# Patient Record
Sex: Male | Born: 1965 | Race: Black or African American | Hispanic: No | Marital: Single | Smoking: Current every day smoker
Health system: Southern US, Community
[De-identification: ages and names within clinical notes are randomized; demographics above are authoritative.]

## PROBLEM LIST (undated history)

## (undated) DIAGNOSIS — H269 Unspecified cataract: Secondary | ICD-10-CM

## (undated) DIAGNOSIS — E119 Type 2 diabetes mellitus without complications: Secondary | ICD-10-CM

## (undated) DIAGNOSIS — G629 Polyneuropathy, unspecified: Secondary | ICD-10-CM

## (undated) DIAGNOSIS — E7800 Pure hypercholesterolemia, unspecified: Secondary | ICD-10-CM

## (undated) DIAGNOSIS — I10 Essential (primary) hypertension: Secondary | ICD-10-CM

## (undated) HISTORY — PX: EAR CYST EXCISION: SHX22

---

## 2001-01-02 ENCOUNTER — Emergency Department (HOSPITAL_COMMUNITY): Admission: EM | Admit: 2001-01-02 | Discharge: 2001-01-02 | Payer: Self-pay | Admitting: Internal Medicine

## 2001-10-27 ENCOUNTER — Emergency Department (HOSPITAL_COMMUNITY): Admission: EM | Admit: 2001-10-27 | Discharge: 2001-10-27 | Payer: Self-pay | Admitting: Emergency Medicine

## 2001-10-27 ENCOUNTER — Encounter: Payer: Self-pay | Admitting: Emergency Medicine

## 2007-08-27 ENCOUNTER — Emergency Department (HOSPITAL_COMMUNITY): Admission: EM | Admit: 2007-08-27 | Discharge: 2007-08-27 | Payer: Self-pay | Admitting: Emergency Medicine

## 2007-11-01 ENCOUNTER — Emergency Department (HOSPITAL_COMMUNITY): Admission: EM | Admit: 2007-11-01 | Discharge: 2007-11-01 | Payer: Self-pay | Admitting: Emergency Medicine

## 2008-11-21 ENCOUNTER — Emergency Department (HOSPITAL_COMMUNITY): Admission: EM | Admit: 2008-11-21 | Discharge: 2008-11-21 | Payer: Self-pay | Admitting: Emergency Medicine

## 2010-12-18 LAB — RAPID STREP SCREEN (MED CTR MEBANE ONLY): Streptococcus, Group A Screen (Direct): NEGATIVE

## 2011-06-12 LAB — URINE MICROSCOPIC-ADD ON

## 2011-06-12 LAB — URINALYSIS, ROUTINE W REFLEX MICROSCOPIC
Bilirubin Urine: NEGATIVE
Glucose, UA: NEGATIVE
Ketones, ur: NEGATIVE
Leukocytes, UA: NEGATIVE
Nitrite: NEGATIVE
Protein, ur: 30 — AB
Specific Gravity, Urine: >1.03 — ABNORMAL HIGH
Urobilinogen, UA: 0.2
pH: 6

## 2011-06-12 LAB — GC/CHLAMYDIA PROBE AMP, GENITAL
Chlamydia, DNA Probe: NEGATIVE
GC Probe Amp, Genital: NEGATIVE

## 2011-06-12 LAB — URINE CULTURE: Colony Count: 8000

## 2012-11-22 ENCOUNTER — Emergency Department (HOSPITAL_COMMUNITY)
Admission: EM | Admit: 2012-11-22 | Discharge: 2012-11-22 | Disposition: A | Discharge status: Home / Self Care | Payer: Managed Care, Other (non HMO) | Attending: Emergency Medicine | Admitting: Emergency Medicine

## 2012-11-22 ENCOUNTER — Encounter (HOSPITAL_COMMUNITY): Payer: Self-pay

## 2012-11-22 DIAGNOSIS — F17200 Nicotine dependence, unspecified, uncomplicated: Secondary | ICD-10-CM | POA: Insufficient documentation

## 2012-11-22 DIAGNOSIS — K469 Unspecified abdominal hernia without obstruction or gangrene: Secondary | ICD-10-CM | POA: Insufficient documentation

## 2012-11-22 DIAGNOSIS — K4090 Unilateral inguinal hernia, without obstruction or gangrene, not specified as recurrent: Secondary | ICD-10-CM

## 2012-11-22 DIAGNOSIS — R112 Nausea with vomiting, unspecified: Secondary | ICD-10-CM | POA: Insufficient documentation

## 2012-11-22 NOTE — ED Provider Notes (Signed)
History  This chart was scribed for Benny Lennert, MD by Bennett Scrape, ED Scribe. This patient was seen in room APA19/AP[facility]19 and the patient's care was started at 7:36 AM.  CSN: [id number]  Arrival date & time 11/22/12  0729   First MD Initiated Contact with Patient 11/22/12 [phone]      Chief Complaint  Patient presents with  . Abdominal Pain     Patient is a 47 y.o. male presenting with groin pain. The history is provided by the patient. No language interpreter was used.  Groin Pain This is a new problem. The current episode started more than 1 week ago. Pertinent negatives include no abdominal pain and no headaches.    Dylan Roberts is a 47 y.o. male who presents to the Emergency Department complaining of 3 weeks of intermittent right inguinal pain described as achy with associated nausea and emesis. He reports that he first noticed the pain after heavy lifting. He denies any prior abdominal surgeries. He denies any fevers, back pain, abdominal pain or diarrhea. He does not have a h/o chronic medical conditions but is a current everyday smoker and occasional alcohol user.  Pt works in a [employer]   History reviewed. No pertinent past medical history.  History reviewed. No pertinent past surgical history.  No family history on file.  History  Substance Use Topics  . Smoking status: Current Every Day Smoker    Types: Cigarettes  . Smokeless tobacco: Not on file  . Alcohol Use: Yes     Comment: daily      Review of Systems  HENT: Negative for congestion, sinus pressure and ear discharge.   Eyes: Negative for discharge.  Gastrointestinal: Positive for nausea and vomiting. Negative for abdominal pain.  Genitourinary: Negative for frequency.       Right inguinal pain  Musculoskeletal: Negative for back pain.  Skin: Negative for rash.  Neurological: Negative for seizures and headaches.  Psychiatric/Behavioral: Negative for hallucinations.    Allergies   Review of patient's allergies indicates not on file.  Home Medications  No current outpatient prescriptions on file.  Triage Vitals: BP 167/102  Pulse 90  Temp(Src) 97.8 F (36.6 C) (Oral)  Resp 20  Ht 5\' 8"  (1.727 m)  Wt 151 lb (68.493 kg)  BMI 22.96 kg/m2  SpO2 100%  Physical Exam  Nursing note and vitals reviewed. Constitutional: He is oriented to person, place, and time. He appears well-developed and well-nourished.  HENT:  Head: Normocephalic and atraumatic.  Eyes: Conjunctivae and EOM are normal. No scleral icterus.  Neck: Neck supple. No thyromegaly present.  Cardiovascular: Normal rate and regular rhythm.  Exam reveals no gallop and no friction rub.   No murmur heard. Pulmonary/Chest: No stridor. He has no wheezes. He has no rales. He exhibits no tenderness.  Abdominal: He exhibits no distension. There is no tenderness. There is no rebound.  Genitourinary:  Moderate right inguinal hernia, minimal tenderness in the right groin  Musculoskeletal: Normal range of motion. He exhibits no edema.  Lymphadenopathy:    He has no cervical adenopathy.  Neurological: He is oriented to person, place, and time. Coordination normal.  Skin: No rash noted. No erythema.  Psychiatric: He has a normal mood and affect. His behavior is normal.    ED Course  Procedures (including critical care time)  DIAGNOSTIC STUDIES: Oxygen Saturation is 100% on room air, normal by my interpretation.    COORDINATION OF CARE: 7:47 AM-Discussed discharge plan which includes  heavy lifting restrictions and motrin or tylenol for pain with pt and pt agreed to plan. Also advised pt to follow up with surgeon referral and pt agreed.  Labs Reviewed - No data to display No results found.   No diagnosis found.    MDM       The chart was scribed for me under my direct supervision.  I personally performed the history, physical, and medical decision making and all procedures in the evaluation of this  patient.Benny Lennert, MD 11/22/12 [phone]

## 2012-11-22 NOTE — ED Notes (Signed)
Pt reports ?hearnia for last 2-3 weeks, pt has swelling to mid ab area, noted after heavy lifting and describes as "pins".

## 2012-12-01 ENCOUNTER — Encounter (HOSPITAL_COMMUNITY): Payer: Self-pay | Admitting: Pharmacy Technician

## 2012-12-01 NOTE — H&P (Signed)
  NTS SOAP Note  Vital Signs:  Vitals as of: 12/01/2012: Systolic 153: Diastolic 99: Heart Rate 89: Temp 97.10F: Height 71ft 8in: Weight 145Lbs 0 Ounces: Pain Level 7: BMI 22.05  BMI : 22.05 kg/m2  Subjective: This 47 Years 26 Months old Male presents for of    HERNIA: ,Has had a right groin pain and swelling over the past three months.  Made worse with straining.  Makes it difficult to work.  Has to lift 60 pounds.  Review of Symptoms:  Constitutional:unremarkable   Head:unremarkable    Eyes:unremarkable   Nose/Mouth/Throat:unremarkable Cardiovascular:  unremarkable   Respiratory:unremarkable   Gastrointestin    abdominal pain,heartburn Genitourinary:unremarkable     Musculoskeletal:unremarkable   Skin:unremarkable Hematolgic/Lymphatic:unremarkable     Allergic/Immunologic:unremarkable     Past Medical History:    Reviewed   Past Medical History  Surgical History: none Medical Problems: none Allergies: nkda Medications: none   Social History:Reviewed  Social History  Preferred Language: English Race:  Black or African American Ethnicity: Not Hispanic / Latino Age: 47 Years 6 Months Marital Status:  M Alcohol:  Yes, How much 2 beers per day Recreational drug(s):  No   Smoking Status: Current every day smoker reviewed on 12/01/2012 Started Date: 09/07/1985 Packs per day: 1.00 Functional Status reviewed on mm/dd/yyyy ------------------------------------------------ Bathing: Normal Cooking: Normal Dressing: Normal Driving: Normal Eating: Normal Managing Meds: Normal Oral Care: Normal Shopping: Normal Toileting: Normal Transferring: Normal Walking: Normal Cognitive Status reviewed on mm/dd/yyyy ------------------------------------------------ Attention: Normal Decision Making: Normal Language: Normal Memory: Normal Motor: Normal Perception: Normal Problem Solving: Normal Visual and Spatial: Normal   Family  History:  Reviewed   Family History              Mother:  Diabetes Type II    Objective Information: General:  Well appearing, well nourished in no distress. Heart:  RRR, no murmur or gallop.  Normal S1, S2.  No S3, S4.  Lungs:    CTA bilaterally, no wheezes, rhonchi, rales.  Breathing unlabored. Abdomen:Soft, NT/ND, no HSM, no masses.  Reducible right inguinal hernia. ZO:[id number]    Assessment:Right inguinal hernia  Diagnosis &amp; Procedure Smart Code   Plan:Scheduled for right inguinal herniorrhaphy on 12/09/12.   Patient Education:Alternative treatments to surgery were discussed with patient (and family).  Risks and benefits  of procedure were fully explained to the patient (and family) who gave informed consent. Patient/family questions were addressed.  Follow-up:Pending Surgery

## 2012-12-02 NOTE — Patient Instructions (Addendum)
Dylan Roberts  12/02/2012   Your procedure is scheduled on:  12/09/2012  Report to [facility] at  6:15  AM.  Call this number if you have problems the morning of surgery: [phone]   Remember:   Do not eat food or drink liquids after midnight.   Take these medicines the morning of surgery with A SIP OF WATER: none   Do not wear jewelry, make-up or nail polish.  Do not wear lotions, powders, or perfumes.  Do not shave 48 hours prior to surgery. Men may shave face and neck.  Do not bring valuables to the hospital.  Contacts, dentures or bridgework may not be worn into surgery.  Leave suitcase in the car. After surgery it may be brought to your room.   Patients discharged the day of surgery will not be allowed to drive  home.    Special Instructions: Shower using CHG 2 nights before surgery and the night before surgery.  If you shower the day of surgery use CHG.  Use special wash - you have one bottle of CHG for all showers.  You should use approximately 1/3 of the bottle for each shower.   Please read over the following fact sheets that you were given: Pain Booklet, Coughing and Deep Breathing, MRSA Information, Surgical Site Infection Prevention, Anesthesia Post-op Instructions and Care and Recovery After Surgery    Hernia, Surgical Repair A hernia occurs when an internal organ pushes out through a weak spot in the belly (abdominal) wall muscles. Hernias commonly occur in the groin and around the navel. Hernias often can be pushed back into place (reduced). Most hernias tend to get worse over time. Problems occur when abdominal contents get stuck in the opening (incarcerated hernia). The blood supply gets cut off (strangulated hernia). This is an emergency and needs surgery. Otherwise, hernia repair can be an elective procedure. This means you can schedule this at your convenience when an emergency is not present. Because complications can occur, if you decide to repair the hernia,  it is best to do it soon. When it becomes an emergency procedure, there is increased risk of complications after surgery. CAUSES   Heavy lifting.  Obesity.  Prolonged coughing.  Straining to move your bowels.  Hernias can also occur through a cut (incision) by a surgeonafter an abdominal operation. HOME CARE INSTRUCTIONS Before the repair:  Bed rest is not required. You may continue your normal activities, but avoid heavy lifting (more than 10 pounds) or straining. Cough gently. If you are a smoker, it is best to stop. Even the best hernia repair can break down with the continual strain of coughing.  Do not wear anything tight over your hernia. Do not try to keep it in with an outside bandage or truss. These can damage abdominal contents if they are trapped in the hernia sac.  Eat a normal diet. Avoid constipation. Straining over long periods of time to have a bowel movement will increase hernia size. It also can breakdown repairs. If you cannot do this with diet alone, laxatives or stool softeners may be used. PRIOR TO SURGERY, SEEK IMMEDIATE MEDICAL CARE IF: You have problems (symptoms) of a trapped (incarcerated) hernia. Symptoms include:  An oral temperature above 102 F (38.9 C) develops, or as your caregiver suggests.  Increasing abdominal pain.  Feeling sick to your stomach(nausea) and vomiting.  You stop passing gas or stool.  The hernia is stuck outside the abdomen, looks discolored,  feels hard, or is tender.  You have any changes in your bowel habits or in the hernia that is unusual for you. LET YOUR CAREGIVERS KNOW ABOUT THE FOLLOWING:  Allergies.  Medications taken including herbs, eye drops, over the counter medications, and creams.  Use of steroids (by mouth or creams).  Family or personal history of problems with anesthetics or Novocaine.  Possibility of pregnancy, if this applies.  Personal history of blood clots (thrombophlebitis).  Family or personal  history of bleeding or blood problems.  Previous surgery.  Other health problems. BEFORE THE PROCEDURE You should be present 1 hour prior to your procedure, or as directed by your caregiver.  AFTER THE PROCEDURE After surgery, you will be taken to the recovery area. A nurse will watch and check your progress there. Once you are awake, stable, and taking fluids well, you will be allowed to go home as long as there are no problems. Once home, an ice pack (wrapped in a light towel) applied to your operative site may help with discomfort. It may also keep the swelling down. Do not lift anything heavier than 10 pounds (4.55 kilograms). Take showers not baths. Do not drive while taking narcotics. Follow instructions as suggested by your caregiver.  SEEK IMMEDIATE MEDICAL CARE IF: After surgery:  There is redness, swelling, or increasing pain in the wound.  There is pus coming from the wound.  There is drainage from a wound lasting longer than 1 day.  An unexplained oral temperature above 102 F (38.9 C) develops.  You notice a foul smell coming from the wound or dressing.  There is a breaking open of a wound (edged not staying together) after the sutures have been removed.  You notice increasing pain in the shoulders (shoulder strap areas).  You develop dizzy episodes or fainting while standing.  You develop persistent nausea or vomiting.  You develop a rash.  You have difficulty breathing.  You develop any reaction or side effects to medications given. MAKE SURE YOU:   Understand these instructions.  Will watch your condition.  Will get help right away if you are not doing well or get worse. Document Released: 02/17/2001 Document Revised: 11/16/2011 Document Reviewed: 01/10/2008 [facility] Patient Information 2013 [place], [place].   PATIENT INSTRUCTIONS POST-ANESTHESIA  IMMEDIATELY FOLLOWING SURGERY:  Do not drive or operate machinery for the first twenty four hours after  surgery.  Do not make any important decisions for twenty four hours after surgery or while taking narcotic pain medications or sedatives.  If you develop intractable nausea and vomiting or a severe headache please notify your doctor immediately.  FOLLOW-UP:  Please make an appointment with your surgeon as instructed. You do not need to follow up with anesthesia unless specifically instructed to do so.  WOUND CARE INSTRUCTIONS (if applicable):  Keep a dry clean dressing on the anesthesia/puncture wound site if there is drainage.  Once the wound has quit draining you may leave it open to air.  Generally you should leave the bandage intact for twenty four hours unless there is drainage.  If the epidural site drains for more than 36-48 hours please call the anesthesia department.  QUESTIONS?:  Please feel free to call your physician or the hospital operator if you have any questions, and they will be happy to assist you.

## 2012-12-05 ENCOUNTER — Encounter (HOSPITAL_COMMUNITY)
Admission: RE | Admit: 2012-12-05 | Discharge: 2012-12-05 | Disposition: A | Discharge status: Home / Self Care | Payer: Managed Care, Other (non HMO) | Source: Ambulatory Visit | Attending: General Surgery | Admitting: General Surgery

## 2012-12-05 ENCOUNTER — Encounter (HOSPITAL_COMMUNITY): Payer: Self-pay

## 2012-12-05 LAB — CBC WITH DIFFERENTIAL/PLATELET
Basophils Absolute: 0.1 10*3/uL (ref 0.0–0.1)
Basophils Relative: 1 % (ref 0–1)
Eosinophils Absolute: 0.2 10*3/uL (ref 0.0–0.7)
Eosinophils Relative: 2 % (ref 0–5)
HCT: 43.1 % (ref 39.0–52.0)
Hemoglobin: 15.1 g/dL (ref 13.0–17.0)
Lymphocytes Relative: 28 % (ref 12–46)
Lymphs Abs: 2.3 10*3/uL (ref 0.7–4.0)
MCH: 30.1 pg (ref 26.0–34.0)
MCHC: 35 g/dL (ref 30.0–36.0)
MCV: 86 fL (ref 78.0–100.0)
Monocytes Absolute: 0.3 10*3/uL (ref 0.1–1.0)
Monocytes Relative: 4 % (ref 3–12)
Neutro Abs: 5.3 10*3/uL (ref 1.7–7.7)
Neutrophils Relative %: 65 % (ref 43–77)
Platelets: 215 10*3/uL (ref 150–400)
RBC: 5.01 MIL/uL (ref 4.22–5.81)
RDW: 13.6 % (ref 11.5–15.5)
WBC: 8.2 10*3/uL (ref 4.0–10.5)

## 2012-12-05 LAB — BASIC METABOLIC PANEL
BUN: 9 mg/dL (ref 6–23)
CO2: 24 mEq/L (ref 19–32)
Calcium: 9.4 mg/dL (ref 8.4–10.5)
Chloride: 103 mEq/L (ref 96–112)
Creatinine, Ser: 0.91 mg/dL (ref 0.50–1.35)
GFR calc Af Amer: >90 mL/min (ref >90)
GFR calc non Af Amer: >90 mL/min (ref >90)
Glucose, Bld: 236 mg/dL — ABNORMAL HIGH (ref 70–99)
Potassium: 4 mEq/L (ref 3.5–5.1)
Sodium: 138 mEq/L (ref 135–145)

## 2012-12-05 LAB — SURGICAL PCR SCREEN
MRSA, PCR: NEGATIVE
Staphylococcus aureus: NEGATIVE

## 2012-12-05 MED ORDER — CHLORHEXIDINE GLUCONATE 4 % EX LIQD: 1.0000 "application " | Freq: Once | CUTANEOUS | Status: DC

## 2012-12-08 NOTE — OR Nursing (Signed)
cbg   from pat workup 236, reported to Dr. Lovell Sheehan and Dr Jayme Cloud. Orders to perform cbg on arrival to preop.

## 2012-12-09 ENCOUNTER — Encounter (HOSPITAL_COMMUNITY): Payer: Self-pay | Admitting: Anesthesiology

## 2012-12-09 ENCOUNTER — Ambulatory Visit (HOSPITAL_COMMUNITY)
Admission: RE | Admit: 2012-12-09 | Discharge: 2012-12-09 | Disposition: A | Discharge status: Home / Self Care | Payer: Managed Care, Other (non HMO) | Source: Ambulatory Visit | Attending: General Surgery | Admitting: General Surgery

## 2012-12-09 ENCOUNTER — Ambulatory Visit (HOSPITAL_COMMUNITY): Payer: Managed Care, Other (non HMO) | Admitting: Anesthesiology

## 2012-12-09 ENCOUNTER — Encounter (HOSPITAL_COMMUNITY)
Admission: RE | Disposition: A | Discharge status: Home / Self Care | Payer: Self-pay | Source: Ambulatory Visit | Attending: General Surgery

## 2012-12-09 ENCOUNTER — Encounter (HOSPITAL_COMMUNITY): Payer: Self-pay | Admitting: *Deleted

## 2012-12-09 DIAGNOSIS — K4090 Unilateral inguinal hernia, without obstruction or gangrene, not specified as recurrent: Secondary | ICD-10-CM | POA: Insufficient documentation

## 2012-12-09 DIAGNOSIS — Z01812 Encounter for preprocedural laboratory examination: Secondary | ICD-10-CM | POA: Insufficient documentation

## 2012-12-09 HISTORY — PX: INSERTION OF MESH: SHX5868

## 2012-12-09 HISTORY — PX: INGUINAL HERNIA REPAIR: SHX194

## 2012-12-09 LAB — GLUCOSE, CAPILLARY
Glucose-Capillary: 209 mg/dL — ABNORMAL HIGH (ref 70–99)
Glucose-Capillary: 185 mg/dL — ABNORMAL HIGH (ref 70–99)

## 2012-12-09 SURGERY — REPAIR, HERNIA, INGUINAL, ADULT
Anesthesia: General | Site: Abdomen | Laterality: Right | Wound class: Clean

## 2012-12-09 MED ORDER — BUPIVACAINE HCL (PF) 0.5 % IJ SOLN
INTRAMUSCULAR | Status: AC
  Filled 2012-12-09: qty 30

## 2012-12-09 MED ORDER — FENTANYL CITRATE 0.05 MG/ML IJ SOLN
INTRAMUSCULAR | Status: AC
  Filled 2012-12-09: qty 2

## 2012-12-09 MED ORDER — OXYCODONE-ACETAMINOPHEN 7.5-325 MG PO TABS: 1.0000 | ORAL_TABLET | ORAL | Status: DC | PRN

## 2012-12-09 MED ORDER — MIDAZOLAM HCL 2 MG/2ML IJ SOLN
INTRAMUSCULAR | Status: AC
  Filled 2012-12-09: qty 2

## 2012-12-09 MED ORDER — KETOROLAC TROMETHAMINE 30 MG/ML IJ SOLN
INTRAMUSCULAR | Status: AC
  Filled 2012-12-09: qty 1

## 2012-12-09 MED ORDER — LIDOCAINE HCL (CARDIAC) 10 MG/ML IV SOLN
INTRAVENOUS | Status: DC | PRN
  Administered 2012-12-09: 20 mg via INTRAVENOUS

## 2012-12-09 MED ORDER — ONDANSETRON HCL 4 MG/2ML IJ SOLN
INTRAMUSCULAR | Status: AC
  Filled 2012-12-09: qty 2

## 2012-12-09 MED ORDER — SODIUM CHLORIDE 0.9 % IR SOLN
Status: DC | PRN
  Administered 2012-12-09: 1000 mL

## 2012-12-09 MED ORDER — PROPOFOL 10 MG/ML IV EMUL
INTRAVENOUS | Status: AC
  Filled 2012-12-09: qty 20

## 2012-12-09 MED ORDER — PROPOFOL 10 MG/ML IV BOLUS
INTRAVENOUS | Status: DC | PRN
  Administered 2012-12-09: 20 mg via INTRAVENOUS
  Administered 2012-12-09: 150 mg via INTRAVENOUS

## 2012-12-09 MED ORDER — BUPIVACAINE HCL (PF) 0.5 % IJ SOLN
INTRAMUSCULAR | Status: DC | PRN
  Administered 2012-12-09: 10 mL

## 2012-12-09 MED ORDER — LACTATED RINGERS IV SOLN
INTRAVENOUS | Status: DC
  Administered 2012-12-09: 1000 mL via INTRAVENOUS

## 2012-12-09 MED ORDER — KETOROLAC TROMETHAMINE 30 MG/ML IJ SOLN
30.0000 mg | Freq: Once | INTRAMUSCULAR | Status: AC
  Administered 2012-12-09: 30 mg via INTRAVENOUS

## 2012-12-09 MED ORDER — MIDAZOLAM HCL 2 MG/2ML IJ SOLN
1.0000 mg | INTRAMUSCULAR | Status: DC | PRN
  Administered 2012-12-09 (×2): 2 mg via INTRAVENOUS

## 2012-12-09 MED ORDER — FENTANYL CITRATE 0.05 MG/ML IJ SOLN
25.0000 ug | INTRAMUSCULAR | Status: DC | PRN
  Administered 2012-12-09 (×4): 50 ug via INTRAVENOUS

## 2012-12-09 MED ORDER — FENTANYL CITRATE 0.05 MG/ML IJ SOLN
INTRAMUSCULAR | Status: DC | PRN
  Administered 2012-12-09 (×2): 25 ug via INTRAVENOUS
  Administered 2012-12-09: 50 ug via INTRAVENOUS
  Administered 2012-12-09 (×2): 25 ug via INTRAVENOUS
  Administered 2012-12-09: 50 ug via INTRAVENOUS

## 2012-12-09 MED ORDER — ONDANSETRON HCL 4 MG/2ML IJ SOLN
4.0000 mg | Freq: Once | INTRAMUSCULAR | Status: AC
  Administered 2012-12-09: 4 mg via INTRAVENOUS

## 2012-12-09 MED ORDER — ONDANSETRON HCL 4 MG/2ML IJ SOLN
4.0000 mg | Freq: Once | INTRAMUSCULAR | Status: AC | PRN
  Administered 2012-12-09: 4 mg via INTRAVENOUS

## 2012-12-09 MED ORDER — CEFAZOLIN SODIUM-DEXTROSE 2-3 GM-% IV SOLR
2.0000 g | INTRAVENOUS | Status: AC
  Administered 2012-12-09: 2 g via INTRAVENOUS

## 2012-12-09 MED ORDER — CEFAZOLIN SODIUM-DEXTROSE 2-3 GM-% IV SOLR
INTRAVENOUS | Status: AC
  Filled 2012-12-09: qty 50

## 2012-12-09 SURGICAL SUPPLY — 39 items
BAG HAMPER (MISCELLANEOUS) ×2 IMPLANT
CLOTH BEACON ORANGE TIMEOUT ST (SAFETY) ×2 IMPLANT
COVER LIGHT HANDLE STERIS (MISCELLANEOUS) ×4 IMPLANT
DECANTER SPIKE VIAL GLASS SM (MISCELLANEOUS) ×2 IMPLANT
DERMABOND ADVANCED (GAUZE/BANDAGES/DRESSINGS) ×1
DERMABOND ADVANCED .7 DNX12 (GAUZE/BANDAGES/DRESSINGS) ×1 IMPLANT
DRAIN PENROSE 18X.75 LTX STRL (MISCELLANEOUS) ×2 IMPLANT
ELECT REM PT RETURN 9FT ADLT (ELECTROSURGICAL) ×2
ELECTRODE REM PT RTRN 9FT ADLT (ELECTROSURGICAL) ×1 IMPLANT
FORMALIN 10 PREFIL 120ML (MISCELLANEOUS) IMPLANT
GLOVE BIO SURGEON STRL SZ7.5 (GLOVE) ×2 IMPLANT
GLOVE BIOGEL PI IND STRL 7.0 (GLOVE) ×3 IMPLANT
GLOVE BIOGEL PI INDICATOR 7.0 (GLOVE) ×3
GLOVE ECLIPSE 6.5 STRL STRAW (GLOVE) ×2 IMPLANT
GLOVE ECLIPSE 7.0 STRL STRAW (GLOVE) ×2 IMPLANT
GLOVE EXAM NITRILE MD LF STRL (GLOVE) ×2 IMPLANT
GOWN STRL REIN XL XLG (GOWN DISPOSABLE) ×8 IMPLANT
INST SET MINOR GENERAL (KITS) ×2 IMPLANT
KIT ROOM TURNOVER APOR (KITS) ×2 IMPLANT
MANIFOLD NEPTUNE II (INSTRUMENTS) ×2 IMPLANT
MESH MARLEX PLUG MEDIUM (Mesh General) ×2 IMPLANT
NEEDLE HYPO 25X1 1.5 SAFETY (NEEDLE) ×2 IMPLANT
NS IRRIG 1000ML POUR BTL (IV SOLUTION) ×2 IMPLANT
PACK MINOR (CUSTOM PROCEDURE TRAY) ×2 IMPLANT
PAD ARMBOARD 7.5X6 YLW CONV (MISCELLANEOUS) ×2 IMPLANT
SET BASIN LINEN APH (SET/KITS/TRAYS/PACK) ×2 IMPLANT
SOL PREP PROV IODINE SCRUB 4OZ (MISCELLANEOUS) ×2 IMPLANT
SUT NOVA NAB GS-22 2 2-0 T-19 (SUTURE) ×6 IMPLANT
SUT NOVAFIL NAB HGS22 2-0 30IN (SUTURE) IMPLANT
SUT SILK 3 0 (SUTURE)
SUT SILK 3-0 18XBRD TIE 12 (SUTURE) IMPLANT
SUT VIC AB 2-0 CT1 27 (SUTURE) ×1
SUT VIC AB 2-0 CT1 TAPERPNT 27 (SUTURE) ×1 IMPLANT
SUT VIC AB 3-0 SH 27 (SUTURE) ×1
SUT VIC AB 3-0 SH 27X BRD (SUTURE) ×1 IMPLANT
SUT VIC AB 4-0 PS2 27 (SUTURE) ×2 IMPLANT
SUT VICRYL AB 3 0 TIES (SUTURE) ×2 IMPLANT
SYR CONTROL 10ML LL (SYRINGE) ×2 IMPLANT
TOWEL OR 17X26 4PK STRL BLUE (TOWEL DISPOSABLE) IMPLANT

## 2012-12-09 NOTE — Op Note (Signed)
Patient:  Dylan Roberts  DOB:  1966/08/06  MRN:  [id number]   Preop Diagnosis:  Right inguinal hernia  Postop Diagnosis:  Same  Procedure:  Right inguinal herniorrhaphy  Surgeon:  Franky Macho, M.D.  Anes:  General endotracheal  Indications:  Patient is a 47 year old black male who presents with a symptomatic right inguinal hernia. The risks and benefits of the procedure including bleeding, infection, and the possibility of recurrence of the hernia were fully explained to the patient, gave informed consent.  Procedure note:  The patient is placed the supine position. After induction of general endotracheal anesthesia, the right groin region was prepped and draped using usual sterile technique with Betadine. Surgical site confirmation was performed.  A right groin incision was made down to the external oblique aponeuroses. The aponeuroses was incised to the external ring. The ilioinguinal nerve was identified retracted pearly from the operative field. A Penrose drain was placed around the spermatic cord. An indirect hernia sac was found. This was freed away from the spermatic cord up to the peritoneal reflection and inverted. A Bard medium PerFix mesh plug was then inserted and secured to the transversalis fascia using a 20 Novafil interrupted suture. An onlay patch was then placed along the floor of inguinal canal and secured superiorly to the conjoined tendon and inferiorly to the shelving edge of Poupart's ligament using 2-0 Novafil interrupted sutures. The internal ring was recreated using a 2-0 Novafil interrupted suture. The external oblique aponeuroses was reapproximated using a 2-0 Vicryl running suture. The subcutaneous layer was reapproximated using 3-0 Vicryl interrupted suture. The skin was closed using a 4-0 Vicryl subcuticular suture. 0.5 % Sensorcaine was instilled the surrounding wound. Dermabond was then applied.  All tape and needle counts were correct at the end of the  procedure. Patient was extubated in the operating room and transferred to PACU in stable condition.  Complications:  None  EBL:  Minimal  Specimen:  None

## 2012-12-09 NOTE — Progress Notes (Signed)
Pt and wife informed of high blood sugar and need to see MD ASAP after surgery for treatment.

## 2012-12-09 NOTE — Anesthesia Postprocedure Evaluation (Signed)
Anesthesia Post Note  Patient: Dylan Roberts  Procedure(s) Performed: Procedure(s) (LRB): HERNIA REPAIR INGUINAL ADULT (Right) INSERTION OF MESH (Right)  Anesthesia type: General  Patient location: PACU  Post pain: Pain level controlled  Post assessment: Post-op Vital signs reviewed, Patient's Cardiovascular Status Stable, Respiratory Function Stable, Patent Airway, No signs of Nausea or vomiting and Pain level controlled  Last Vitals:  Filed Vitals:   12/09/12 1021  BP: 145/98  Pulse: 86  Temp: 36.4 C  Resp: 14    Post vital signs: Reviewed and stable  Level of consciousness: awake and alert   Complications: No apparent anesthesia complications

## 2012-12-09 NOTE — Interval H&P Note (Signed)
History and Physical Interval Note:  12/09/2012 9:01 AM  Dylan Roberts  has presented today for surgery, with the diagnosis of right inguinal hernia  The various methods of treatment have been discussed with the patient and family. After consideration of risks, benefits and other options for treatment, the patient has consented to  Procedure(s): HERNIA REPAIR INGUINAL ADULT (Right) as a surgical intervention .  The patient's history has been reviewed, patient examined, no change in status, stable for surgery.  I have reviewed the patient's chart and labs.  Questions were answered to the patient's satisfaction.     Franky Macho A

## 2012-12-09 NOTE — Anesthesia Preprocedure Evaluation (Addendum)
Anesthesia Evaluation  Patient identified by MRN, date of birth, ID band Patient awake    Reviewed: Allergy & Precautions, H&P , NPO status , Patient's Chart, lab work & pertinent test results  Airway Mallampati: II TM Distance: >3 FB Neck ROM: Full    Dental  (+) Teeth Intact   Pulmonary Current Smoker,  breath sounds clear to auscultation        Cardiovascular negative cardio ROS  Rhythm:Regular Rate:Normal     Neuro/Psych    GI/Hepatic negative GI ROS,   Endo/Other  diabetes (new onset)  Renal/GU      Musculoskeletal   Abdominal   Peds  Hematology   Anesthesia Other Findings   Reproductive/Obstetrics                           Anesthesia Physical Anesthesia Plan  ASA: II  Anesthesia Plan: General   Post-op Pain Management:    Induction: Intravenous  Airway Management Planned: LMA  Additional Equipment:   Intra-op Plan:   Post-operative Plan: Extubation in OR  Informed Consent: I have reviewed the patients History and Physical, chart, labs and discussed the procedure including the risks, benefits and alternatives for the proposed anesthesia with the patient or authorized representative who has indicated his/her understanding and acceptance.     Plan Discussed with:   Anesthesia Plan Comments:         Anesthesia Quick Evaluation

## 2012-12-09 NOTE — Transfer of Care (Signed)
Immediate Anesthesia Transfer of Care Note  Patient: Dylan Roberts  Procedure(s) Performed: Procedure(s) (LRB): HERNIA REPAIR INGUINAL ADULT (Right) INSERTION OF MESH (Right)  Patient Location: PACU  Anesthesia Type: General  Level of Consciousness: awake  Airway & Oxygen Therapy: Patient Spontanous Breathing and non-rebreather face mask  Post-op Assessment: Report given to PACU RN, Post -op Vital signs reviewed and stable and Patient moving all extremities  Post vital signs: Reviewed and stable  Complications: No apparent anesthesia complications

## 2012-12-09 NOTE — Anesthesia Procedure Notes (Signed)
Procedure Name: LMA Insertion Date/Time: 12/09/2012 9:13 AM Performed by: Franco Nones Pre-anesthesia Checklist: Patient identified, Patient being monitored, Emergency Drugs available, Timeout performed and Suction available Patient Re-evaluated:Patient Re-evaluated prior to inductionOxygen Delivery Method: Circle System Utilized Preoxygenation: Pre-oxygenation with 100% oxygen Intubation Type: IV induction Ventilation: Mask ventilation without difficulty LMA: LMA inserted LMA Size: 4.0 Number of attempts: 1 Placement Confirmation: positive ETCO2 and breath sounds checked- equal and bilateral

## 2012-12-13 ENCOUNTER — Encounter (HOSPITAL_COMMUNITY): Payer: Self-pay | Admitting: General Surgery

## 2013-09-13 ENCOUNTER — Emergency Department (HOSPITAL_COMMUNITY): Payer: Managed Care, Other (non HMO)

## 2013-09-13 ENCOUNTER — Emergency Department (HOSPITAL_COMMUNITY)
Admission: EM | Admit: 2013-09-13 | Discharge: 2013-09-14 | Disposition: A | Discharge status: Home / Self Care | Payer: Managed Care, Other (non HMO) | Attending: Emergency Medicine | Admitting: Emergency Medicine

## 2013-09-13 ENCOUNTER — Encounter (HOSPITAL_COMMUNITY): Payer: Self-pay | Admitting: Emergency Medicine

## 2013-09-13 DIAGNOSIS — R52 Pain, unspecified: Secondary | ICD-10-CM | POA: Insufficient documentation

## 2013-09-13 DIAGNOSIS — J111 Influenza due to unidentified influenza virus with other respiratory manifestations: Secondary | ICD-10-CM | POA: Insufficient documentation

## 2013-09-13 DIAGNOSIS — R69 Illness, unspecified: Secondary | ICD-10-CM

## 2013-09-13 DIAGNOSIS — F17200 Nicotine dependence, unspecified, uncomplicated: Secondary | ICD-10-CM | POA: Insufficient documentation

## 2013-09-13 DIAGNOSIS — J069 Acute upper respiratory infection, unspecified: Secondary | ICD-10-CM | POA: Insufficient documentation

## 2013-09-13 DIAGNOSIS — R197 Diarrhea, unspecified: Secondary | ICD-10-CM | POA: Insufficient documentation

## 2013-09-13 LAB — RAPID STREP SCREEN (MED CTR MEBANE ONLY): Streptococcus, Group A Screen (Direct): NEGATIVE

## 2013-09-13 MED ORDER — DM-GUAIFENESIN ER 30-600 MG PO TB12: 1.0000 | ORAL_TABLET | Freq: Two times a day (BID) | ORAL | Status: DC

## 2013-09-13 MED ORDER — OSELTAMIVIR PHOSPHATE 75 MG PO CAPS: 75.0000 mg | ORAL_CAPSULE | Freq: Two times a day (BID) | ORAL | Status: DC

## 2013-09-13 MED ORDER — AMOXICILLIN 500 MG PO CAPS: 500.0000 mg | ORAL_CAPSULE | Freq: Three times a day (TID) | ORAL | Status: DC

## 2013-09-13 NOTE — Discharge Instructions (Signed)
Drink plenty of fluids. Take the mucinex DM for cough. You can take claritin OTC for your runny nose. Take the antibiotic until gone. Recheck if you seem to be getting worse instead of better--struggle to breathe, have vomiting, high fever.

## 2013-09-13 NOTE — ED Notes (Signed)
Pt c/o stuffy nose and body aches x 2 days; pt has had some diarrhea

## 2013-09-13 NOTE — ED Provider Notes (Signed)
CSN: [id number]     Arrival date & time 09/13/13  1843 History  This chart was scribed for Ward Givens, MD by Danella Maiers, ED Scribe. This patient was seen in room [facility]12 and the patient's care was started at 9:07 PM.   Chief Complaint  Patient presents with  . Sore Throat  . Generalized Body Aches   The history is provided by the patient. No language interpreter was used.   HPI Comments: Dylan Roberts is a 48 y.o. male who presents to the Emergency Department complaining of sore throat, nasal congestion, productive cough with yellow sputum, and body aches for the past 2-3 days. He also reports watery diarrhea 2 times per day. He reports clear rhinorrhea that is clear, yellow, and sometimes with some blood. He reports cold chills. He reports central CP when he coughs. He denies fevers, nausea, vomiting, abdominal pain, SOB, wheezing. He denies sick contacts. He is not around any children. He does not have a PCP. He is otherwise healthy. He is a current every day smoker.   PCP none  History reviewed. No pertinent past medical history. Past Surgical History  Procedure Laterality Date  . Ear cyst excision Left     Dr. Malvin Johns  . Inguinal hernia repair Right 12/09/2012    Procedure: HERNIA REPAIR INGUINAL ADULT;  Surgeon: Dalia Heading, MD;  Location: [facility];  Service: General;  Laterality: Right;  Right Inguinal Herniorraphy  . Insertion of mesh Right 12/09/2012    Procedure: INSERTION OF MESH;  Surgeon: Dalia Heading, MD;  Location: [facility];  Service: General;  Laterality: Right;  Insertion of Mesh Plug Right Inguinal   Family History  Problem Relation Age of Onset  . Diabetes Mother    History  Substance Use Topics  . Smoking status: Current Every Day Smoker -- 1.00 packs/day for 30 years    Types: Cigarettes  . Smokeless tobacco: Not on file  . Alcohol Use: 3.6 oz/week    6 Cans of beer per week     Comment: daily use of beer  employed  Review of Systems  HENT: Positive  for congestion and sore throat.   Gastrointestinal: Positive for diarrhea.  Musculoskeletal: Positive for myalgias.  All other systems reviewed and are negative.    Allergies  Review of patient's allergies indicates no known allergies.  Home Medications   Current Outpatient Rx  Name  Route  Sig  Dispense  Refill  . oxyCODONE-acetaminophen (PERCOCET) 7.5-325 MG per tablet   Oral   Take 1-2 tablets by mouth every 4 (four) hours as needed for pain.   50 tablet   0    BP 175/100  Pulse 106  Temp(Src) 98.1 F (36.7 C) (Oral)  Resp 20  Ht 5' 7.5" (1.715 m)  Wt 148 lb (67.132 kg)  BMI 22.82 kg/m2  SpO2 100%  Vital signs normal except tachycardia  Physical Exam  Nursing note and vitals reviewed. Constitutional: He is oriented to person, place, and time. He appears well-developed and well-nourished.  Non-toxic appearance. He does not appear ill. No distress.  HENT:  Head: Normocephalic and atraumatic.  Right Ear: External ear normal.  Left Ear: External ear normal.  Nose: Mucosal edema and rhinorrhea present.  Mouth/Throat: Oropharynx is clear and moist and mucous membranes are normal. No dental abscesses or uvula swelling.  PT sounds stuffy  Eyes: Conjunctivae and EOM are normal. Pupils are equal, round, and reactive to light.  Neck: Normal range of motion  and full passive range of motion without pain. Neck supple.  Cardiovascular: Normal rate, regular rhythm and normal heart sounds.  Exam reveals no gallop and no friction rub.   No murmur heard. Pulmonary/Chest: Effort normal and breath sounds normal. No respiratory distress. He has no wheezes. He has no rhonchi. He has no rales. He exhibits no tenderness and no crepitus.  Abdominal: Soft. Normal appearance and bowel sounds are normal. He exhibits no distension. There is no tenderness. There is no rebound and no guarding.  Musculoskeletal: Normal range of motion. He exhibits no edema and no tenderness.  Moves all  extremities well.   Neurological: He is alert and oriented to person, place, and time. He has normal strength. No cranial nerve deficit.  Skin: Skin is warm, dry and intact. No rash noted. No erythema. No pallor.  Redness on the nose, sounded stuffy  Psychiatric: He has a normal mood and affect. His speech is normal and behavior is normal. His mood appears not anxious.    ED Course  Procedures (including critical care time) Medications - No data to display  DIAGNOSTIC STUDIES: Oxygen Saturation is 100% on RA, normal by my interpretation.    COORDINATION OF CARE: 9:56 PM- Discussed treatment plan with pt which includes rapid strep and CXR. Pt agrees to plan.   Results for orders placed during the hospital encounter of 09/13/13  RAPID STREP SCREEN      Result Value Range   Streptococcus, Group A Screen (Direct) NEGATIVE  NEGATIVE   Dg Chest 2 View  09/13/2013   CLINICAL DATA:  Productive cough.  EXAM: CHEST  2 VIEW  COMPARISON:  None.  FINDINGS: The heart size and mediastinal contours are within normal limits. Both lungs are clear. The visualized skeletal structures are unremarkable.  IMPRESSION: No active cardiopulmonary disease.   Electronically Signed   By: Tiburcio PeaJonathan  Watts M.D.   On: 09/13/2013 22:29      MDM   1. URI (upper respiratory infection)   2. Influenza-like illness     New Prescriptions   AMOXICILLIN (AMOXIL) 500 MG CAPSULE    Take 1 capsule (500 mg total) by mouth 3 (three) times daily.   DEXTROMETHORPHAN-GUAIFENESIN (MUCINEX DM) 30-600 MG PER 12 HR TABLET    Take 1 tablet by mouth 2 (two) times daily.   OSELTAMIVIR (TAMIFLU) 75 MG CAPSULE    Take 1 capsule (75 mg total) by mouth 2 (two) times daily.    Plan discharge   I personally performed the services described in this documentation, which was scribed in my presence. The recorded information has been reviewed and considered.  Devoria AlbeIva Gracin Mcpartland, MD, Armando GangFACEP    Ward GivensIva L Lyanna Blystone, MD 09/13/13 [phone]

## 2013-09-16 LAB — CULTURE, GROUP A STREP

## 2013-11-28 ENCOUNTER — Emergency Department (HOSPITAL_COMMUNITY): Payer: Managed Care, Other (non HMO)

## 2013-11-28 ENCOUNTER — Encounter (HOSPITAL_COMMUNITY): Payer: Self-pay | Admitting: Emergency Medicine

## 2013-11-28 ENCOUNTER — Emergency Department (HOSPITAL_COMMUNITY)
Admission: EM | Admit: 2013-11-28 | Discharge: 2013-11-28 | Disposition: A | Discharge status: Home / Self Care | Payer: Managed Care, Other (non HMO) | Attending: Emergency Medicine | Admitting: Emergency Medicine

## 2013-11-28 DIAGNOSIS — F17200 Nicotine dependence, unspecified, uncomplicated: Secondary | ICD-10-CM | POA: Insufficient documentation

## 2013-11-28 DIAGNOSIS — Z792 Long term (current) use of antibiotics: Secondary | ICD-10-CM | POA: Insufficient documentation

## 2013-11-28 DIAGNOSIS — R7309 Other abnormal glucose: Secondary | ICD-10-CM | POA: Insufficient documentation

## 2013-11-28 DIAGNOSIS — R739 Hyperglycemia, unspecified: Secondary | ICD-10-CM

## 2013-11-28 DIAGNOSIS — R209 Unspecified disturbances of skin sensation: Secondary | ICD-10-CM | POA: Insufficient documentation

## 2013-11-28 DIAGNOSIS — Z79899 Other long term (current) drug therapy: Secondary | ICD-10-CM | POA: Insufficient documentation

## 2013-11-28 DIAGNOSIS — R202 Paresthesia of skin: Secondary | ICD-10-CM

## 2013-11-28 LAB — CBC
HCT: 39.9 % (ref 39.0–52.0)
Hemoglobin: 13.9 g/dL (ref 13.0–17.0)
MCH: 30.4 pg (ref 26.0–34.0)
MCHC: 34.8 g/dL (ref 30.0–36.0)
MCV: 87.3 fL (ref 78.0–100.0)
Platelets: 200 10*3/uL (ref 150–400)
RBC: 4.57 MIL/uL (ref 4.22–5.81)
RDW: 13.9 % (ref 11.5–15.5)
WBC: 5.9 10*3/uL (ref 4.0–10.5)

## 2013-11-28 LAB — BASIC METABOLIC PANEL
BUN: 13 mg/dL (ref 6–23)
CO2: 22 mEq/L (ref 19–32)
Calcium: 8.8 mg/dL (ref 8.4–10.5)
Chloride: 103 mEq/L (ref 96–112)
Creatinine, Ser: 0.93 mg/dL (ref 0.50–1.35)
GFR calc Af Amer: >90 mL/min (ref >90)
GFR calc non Af Amer: >90 mL/min (ref >90)
Glucose, Bld: 280 mg/dL — ABNORMAL HIGH (ref 70–99)
Potassium: 3.8 mEq/L (ref 3.7–5.3)
Sodium: 138 mEq/L (ref 137–147)

## 2013-11-28 LAB — HEMOGLOBIN A1C
Hgb A1c MFr Bld: 10.1 % — ABNORMAL HIGH (ref <5.7)
Mean Plasma Glucose: 243 mg/dL — ABNORMAL HIGH (ref <117)

## 2013-11-28 LAB — CBG MONITORING, ED: Glucose-Capillary: 251 mg/dL — ABNORMAL HIGH (ref 70–99)

## 2013-11-28 MED ORDER — METFORMIN HCL 500 MG PO TABS
500.0000 mg | ORAL_TABLET | Freq: Once | ORAL | Status: AC
  Administered 2013-11-28: 500 mg via ORAL
  Filled 2013-11-28: qty 1

## 2013-11-28 MED ORDER — METFORMIN HCL 500 MG PO TABS: 500.0000 mg | ORAL_TABLET | Freq: Two times a day (BID) | ORAL | Status: DC

## 2013-11-28 NOTE — ED Notes (Signed)
[id number] °

## 2013-11-28 NOTE — ED Notes (Signed)
Message left with case manager for follow up due to newly diagnosed diabetes,

## 2013-11-28 NOTE — ED Notes (Signed)
Orlando PennerMarie Byrd, RN diabetic education coordinator in department, given pt's contact information, advised that she had talked to pt and that pt would be meeting with her this am,

## 2013-11-28 NOTE — ED Provider Notes (Signed)
CSN: [id number]     Arrival date & time 11/28/13  4098 History   First MD Initiated Contact with Patient 11/28/13 [phone]     Chief Complaint  Patient presents with  . Numbness     (Consider location/radiation/quality/duration/timing/severity/associated sxs/prior Treatment) HPI History provided by patient. No history of medical problems, had hernia repair about a year ago. At that time he had blood work drawn and was told that he may have diabetes. He never did follow up. He presents now with 2 days of intermittent left fingertip and left toes tingling. No associated weakness. No other numbness or tingling. No difficulty with speech, gait or vision. No headaches. No polyphagia or polydipsia.  No known aggravating or alleviating factors. Symptoms mild in severity. Patient presents concerned that this may be related to diabetes.   History reviewed. No pertinent past medical history. Past Surgical History  Procedure Laterality Date  . Ear cyst excision Left     Dr. Malvin Johns  . Inguinal hernia repair Right 12/09/2012    Procedure: HERNIA REPAIR INGUINAL ADULT;  Surgeon: Dalia Heading, MD;  Location: [facility];  Service: General;  Laterality: Right;  Right Inguinal Herniorraphy  . Insertion of mesh Right 12/09/2012    Procedure: INSERTION OF MESH;  Surgeon: Dalia Heading, MD;  Location: [facility];  Service: General;  Laterality: Right;  Insertion of Mesh Plug Right Inguinal   Family History  Problem Relation Age of Onset  . Diabetes Mother    History  Substance Use Topics  . Smoking status: Current Every Day Smoker -- 1.00 packs/day for 30 years    Types: Cigarettes  . Smokeless tobacco: Not on file  . Alcohol Use: 3.6 oz/week    6 Cans of beer per week     Comment: daily use of beer    Review of Systems  Constitutional: Negative for fever and chills.  Eyes: Negative for visual disturbance.  Respiratory: Negative for shortness of breath.   Cardiovascular: Negative for chest pain.   Gastrointestinal: Negative for abdominal pain.  Genitourinary: Negative for dysuria.  Musculoskeletal: Negative for back pain, neck pain and neck stiffness.  Skin: Negative for rash.  Neurological: Negative for dizziness, seizures, facial asymmetry, weakness and headaches.  All other systems reviewed and are negative.      Allergies  Review of patient's allergies indicates no known allergies.  Home Medications   Current Outpatient Rx  Name  Route  Sig  Dispense  Refill  . amoxicillin (AMOXIL) 500 MG capsule   Oral   Take 1 capsule (500 mg total) by mouth 3 (three) times daily.   30 capsule   0   . dextromethorphan-guaiFENesin (MUCINEX DM) 30-600 MG per 12 hr tablet   Oral   Take 1 tablet by mouth 2 (two) times daily.   20 tablet   0   . oseltamivir (TAMIFLU) 75 MG capsule   Oral   Take 1 capsule (75 mg total) by mouth 2 (two) times daily.   10 capsule   0   . Phenyleph-CPM-DM-Aspirin (ALKA-SELTZER PLUS COLD & COUGH) 7.04-08-09-325 MG TBEF   Oral   Take 1 packet by mouth as needed (for cold symptoms).          BP 139/88  Pulse 82  Temp(Src) 98.8 F (37.1 C) (Oral)  Resp 16  Ht 5\' 8"  (1.727 m)  Wt 145 lb (65.772 kg)  BMI 22.05 kg/m2  SpO2 99% Physical Exam  Constitutional: He is oriented to person, place,  and time. He appears well-developed and well-nourished.  HENT:  Head: Normocephalic and atraumatic.  Eyes: Conjunctivae and EOM are normal. Pupils are equal, round, and reactive to light.  Neck: Full passive range of motion without pain. Neck supple. No thyromegaly present.  No cervical spine tenderness or deformity.  Cardiovascular: Normal rate, regular rhythm, S1 normal, S2 normal and intact distal pulses.   Pulmonary/Chest: Effort normal and breath sounds normal.  Abdominal: Soft. Bowel sounds are normal. There is no tenderness. There is no CVA tenderness.  Musculoskeletal: Normal range of motion.  Neurological: He is alert and oriented to person,  place, and time. He has normal strength and normal reflexes. He displays normal reflexes. No cranial nerve deficit or sensory deficit. He displays a negative Romberg sign. Coordination normal. GCS eye subscore is 4. GCS verbal subscore is 5. GCS motor subscore is 6.  Normal Gait. Speech clear. No facial droop. No pronator drift. Equal grips, biceps, triceps strength. Equal dorsi plantar flexion. Sensorium to light touch equal and intact throughout upper and lower extremities without measurable deficits.  Skin: Skin is warm and dry. No rash noted. No cyanosis. Nails show no clubbing.  Psychiatric: He has a normal mood and affect. His speech is normal and behavior is normal.    ED Course  Procedures (including critical care time) Labs Review Labs Reviewed  BASIC METABOLIC PANEL - Abnormal; Notable for the following:    Glucose, Bld 280 (*)    All other components within normal limits  CBG MONITORING, ED - Abnormal; Notable for the following:    Glucose-Capillary 251 (*)    All other components within normal limits  CBC  HEMOGLOBIN A1C  CBG MONITORING, ED   Imaging Review Ct Head Wo Contrast  11/28/2013   CLINICAL DATA:  New onset of numbness in the left hand and left foot. Elevated blood sugar.  EXAM: CT HEAD WITHOUT CONTRAST  TECHNIQUE: Contiguous axial images were obtained from the base of the skull through the vertex without intravenous contrast.  COMPARISON:  No priors.  FINDINGS: No acute intracranial abnormalities. Specifically, no evidence of acute intracranial hemorrhage, no definite findings of acute/subacute cerebral ischemia, no mass, mass effect, hydrocephalus or abnormal intra or extra-axial fluid collections. Visualized paranasal sinuses and mastoids are well pneumatized. No acute displaced skull fractures are identified.  IMPRESSION: * No acute intracranial abnormalities. *The appearance of the brain is normal.   Electronically Signed   By: Trudie Reedaniel  Entrikin M.D.   On: 11/28/2013  06:17   Discussed with [facility] hospitalist on-call, Dr. Karilyn CotaGosrani, agrees PT does not require admit for MRI or stroke work up given elevated sugar and symptoms likely related to DM. He is willing to see patient in the office.   Last meal 9:30 PM last night, blood sugar this morning 251, hemoglobin A1c ordered/ pending - PT aware.   Plan discharge home with prescription for metformin, close followup with primary care physician. Outpatient referrals provided.  MDM   Diagnosis: Hyperglycemia, paresthesias  Presents with mild paresthesias and left fingertips and left toes, found to be hyperglycemic. No other complaints and no neuro deficits on exam.  Evaluated with labs and imaging reviewed as above. Medications provided. Vital signs and nursing notes reviewed and considered.    Sunnie NielsenBrian Jowan Skillin, MD 11/28/13 [phone]

## 2013-11-28 NOTE — Progress Notes (Signed)
Went to see patient in ED but patient had been discharged about 10 minutes before I arrived in the ED. Rip Harbour, RN provided me with patient's phone number so patient was called and asked if he would be able to come back to the hospital for diabetes education. Patient reported that he could come back to the hospital and would appreciate any education on diabetes.  Patient came back to the hospital and met with diabetes coordinator in an empty fast track room in the ED. Talked with patient about new diabetes diagnosis. Patient reports that his mother has diabetes but he does not know a lot about diabetes.  Reviewed basic pathophysiology of DM Type 2, basic home care, normal glucose values, importance of checking CBGs and maintaining good CBG control to prevent long-term and short-term complications. Explained what an A1C is and how it is monitored every 3-6 months to determine how diabetes is being controlled. Discussed diet and basics of carbohydrate counting for a Carb Modified diabetic diet.  Provided patient with handout information on free outpatient diabetes education class taught at [facility] and several booklets on carbohydrate meal planning.Patient states that he has insurance but does not have a PCP.  ED nursing staff provided patient with a list of local PCPs in the area and patient states that he will call and establish care with a local PCP from the list he was provided with.  Patient does not have a glucometer. Discussed how to monitor blood glucose and informed patient he may want to check with his insurance company to see if they will cover a glucometer. Also informed patient about the Reli-On glucometer which could be purchased at [facility] for $16 and a box of 50 strips for $9.  Patient verbalized understanding of information discussed and reports that he does not have any further questions related to diabetes at this time.   Thanks, Barnie Alderman, RN, MSN, CCRN Diabetes Coordinator Inpatient  Diabetes Program [phone] (Team Pager) [phone] ([facility] office) [phone] [facility] office)

## 2013-11-28 NOTE — ED Notes (Signed)
Onset of numbness left hand and left foot 2 days ago. Here because his mom told him he might have high sugar.

## 2013-11-28 NOTE — Discharge Instructions (Signed)
Diabetes, Type 2, Am I At Risk? Diabetes is a lasting (chronic) disease. In type 2 diabetes, the pancreas does not make enough insulin, and the body does not respond normally to the insulin that is made. This type of diabetes was also previously called adult onset diabetes. About 90% of all those who have diabetes have type 2. It usually occurs after the age of 72, but can occur at any age.  People develop type 2 diabetes because they do not use insulin properly. Eventually, the pancreas cannot make enough insulin for the body's needs. Over time, the amount of glucose (sugar) in the blood increases. RISK FACTORS  Overweight  the more weight you have, the more resistant your cells become to insulin.  Family history  you are more likely to get diabetes if a parent or sibling has diabetes.  Race certain races get diabetes more.  African Americans.  American Indians.  Asian Americans.  Hispanics.  Pacific Islander.  Inactive exercise helps control weight and helps your cells be more sensitive to insulin.  Gestational diabetes  some women develop diabetes while they are pregnant. This goes away when they deliver. However, they are 50-60% more likely to develop type 2 diabetes at a later time.  Having a baby over 9 pounds  a sign that you may have had gestational diabetes.  Age the risk of diabetes goes up as you get older, especially after age 43.  High blood pressure (hypertension). SYMPTOMS Many people have no signs or symptoms. Symptoms can be so mild that you might not even notice them. Some of these signs are:  Increased thirst.  Increased hunger.  Tiredness (fatigue).  Increased urination, especially at night.  Weight loss.  Blurred vision.  Sores that do not heal. WHO SHOULD BE TESTED?  Anyone 45 years or older, especially if overweight, should consider getting tested.  If you are younger than 45, overweight, and have one or more of the risk factors, you should  consider getting tested. DIAGNOSIS  Fasting blood glucose (FBS). Usually, 2 are done.  FBS 101-125 mg/dl is considered pre-diabetes.  FBS 126 mg/dl or greater is considered diabetes.  2 hour Oral Glucose Tolerance Test (OGTT). This test is preformed by first having you not eat or drink for several hours. You are then given something sweet to drink and your blood glucose is measured fasting, at one hour and 2 hours. This test tells how well you are able to handle sugars or carbohydrates.  Fasting: 60-100 mg/dl.  1 hour: less than 200 mg/dl.  2 hours: less than 140 mg/dl.  A1c A1c is a blood glucose test that gives and average of your blood glucose over 3 months. It is the accepted method to use to diagnose diabetes.  A1c 5.7-6.4% is considered pre-diabetes.  A1c 6.5% or greater is considered diabetes. WHAT DOES IT MEAN TO HAVE PRE-DIABETES? Pre-diabetes means you are at risk for getting type 2 diabetes. Your blood glucose is higher than normal, but not yet high enough to diagnose diabetes. The good news is, if you have pre-diabetes you can reduce the risk of getting diabetes and even return to normal blood glucose levels. With modest weight loss and moderate physical activity, you can delay or prevent type 2 diabetes.  PREVENTION You cannot do anything about race, age or family history, but you can lower your chances of getting diabetes. You can:   Exercise regularly and be active.  Reduce fat and calorie intake.  Make wise food  choices as much as you can.  Reduce your intake of salt and alcohol.  Maintain a reasonable weight.  Keep blood pressure in an acceptable range. Take medication if needed.  Not smoke.  Maintain an acceptable cholesterol level (HDL, LDL, Triglycerides). Take medication if needed. DOING MY PART: GETTING STARTED Making big changes in your life is hard, especially if you are faced with more than one change. You can make it easier by taking these  steps:  Make a plan to change behavior.  Decide exactly what you will do and when you will do it.  Plan what you need to get ready.  Think about what might prevent you from reaching your goals.  Find family and friends who will support and encourage you.  Decide how you will reward yourself when you do what you have planned.  Your doctor, dietitian, or counselor can help you make a plan. HERE ARE SOME OF THE AREAS YOU MAY WISH TO CHANGE TO REDUCE YOUR RISK OF DIABETES. If you are overweight or obese, choose sensible ways to get in shape. Even small amounts of weight loss, like 5-10 pounds, can help reduce the effects of insulin resistance and help blood glucose control. Diet  Avoid crash diets. Instead, eat less of the foods you usually have. Limit the amount of fat you eat.  Increase your physical activity. Aim for at least 30 minutes of exercise most days of the week.  Set a reasonable weight-loss goal, such as losing 1 pound a week. Aim for a long-term goal of losing 5-7% of your total body weight.  Make wise food choices most of the time.  What you eat has a big impact on your health. By making wise food choices, you can help control your body weight, blood pressure, and cholesterol.  Take a hard look at the serving sizes of the foods you eat. Reduce serving sizes of meat, desserts, and foods high in fat. Increase your intake of fruits and vegetables.  Limit your fat intake to about 25% of your total calories. For example, if your food choices add up to about 2,000 calories a day, try to eat no more than 56 grams of fat. Your caregiver or a dietitian can help you figure out how much fat to have. You can check food labels for fat content too.  You may also want to reduce the number of calories you have each day.  Keep a food log. Write down what you eat, how much you eat, and anything else that helps keep you on track.  When you meet your goal, reward yourself with a nonfood  item or activity. Exercise  Be physically active every day.  Keep and exercise log. Write down what exercise you did, for how long, and anything else that keeps you on track.  Regular exercise (like brisk walking) tackles several risk factors at once. It helps you lose weight, it keeps your cholesterol and blood pressure under control, and it helps your body use insulin. People who are physically active for 30 minutes a day, 5 days a week, reduced their risk of type 2 diabetes. If you are not very active, you should start slowly at first. Talk with your caregiver first about what kinds of exercise would be safe for you. Make a plan to increase your activity level with the goal of being active for at least 30 minutes a day, most days of the week.  Choose activities you enjoy. Here are some ways to  work extra activity into your daily routine:  Take the stairs rather than an elevator or escalator.  Park at the far end of the lot and walk.  Get off the bus a few stops early and walk the rest of the way.  Walk or bicycle instead of drive whenever you can. Medications Some people need medication to help control their blood pressure or cholesterol levels. If you do, take your medicines as directed. Ask your caregiver whether there are any medicines you can take to prevent type 2 diabetes. Document Released: 08/27/2003 Document Revised: 11/16/2011 Document Reviewed: 05/22/2009 [facility] Patient Information 2014 [place], [place].  Glucose, Blood Sugar, Fasting Blood Sugar This is a test to measure your blood sugar. Glucose is a simple sugar that serves as the main source of energy for the body. The carbohydrates we eat are broken down into glucose (and a few other simple sugars), absorbed by the small intestine, and circulated throughout the body. Most of the body's cells require glucose for energy production; brain and nervous system cells not only rely on glucose for energy, they can only function  when glucose levels in the blood remain above a certain level.  The body's use of glucose hinges on the availability of insulin, a hormone produced by the pancreas. Insulin acts as a Engineer, maintenance, transporting glucose into the body's cells, directing the body to store excess glucose as glycogen (for short-term storage) and/or as triglycerides in fat cells. We can not live without glucose or insulin, and they must be in balance.  Normally, blood glucose levels rise slightly after a meal, and insulin is secreted to lower them, with the amount of insulin released matched up with the size and content of the meal. If blood glucose levels drop too low, such as might occur in between meals or after a strenuous workout, glucagon (another pancreatic hormone) is secreted to tell the liver to turn some glycogen back into glucose, raising the blood glucose levels. If the glucose/insulin feedback mechanism is working properly, the amount of glucose in the blood remains fairly stable. If the balance is disrupted and glucose levels in the blood rise, then the body tries to restore the balance, both by increasing insulin production and by excreting glucose in the urine.  PREPARATION FOR TEST A blood sample drawn from a vein in your arm or, for a self check, a drop of blood from a skin prick; in general, it may be recommended that you fast before having a blood glucose test; sometimes a random (no preparation) urine sample is used. Your caregiver will instruct you as to what they want prior to your testing. NORMAL FINDINGS Normal values depend on many factors. Your lab will provide a range of normal values with your test results. The following information summarizes the meaning of the test results. These are based on the clinical practice recommendations of the American Diabetes Association.  FASTING BLOOD GLUCOSE  From 70 to 99 mg/dL (3.9 to 5.5 mmol/L): Normal glucose tolerance  From 100 to 125 mg/dL (5.6 to 6.9  mmol/L):Impaired fasting glucose (pre-diabetes)  126 mg/dL (7.0 mmol/L) and above on more than one testing occasion: Diabetes ORAL GLUCOSE TOLERANCE TEST (OGTT) [EXCEPT PREGNANCY] (2 HOURS AFTER A 75-GRAM GLUCOSE DRINK)  Less than 140 mg/dL (7.8 mmol/L): Normal glucose tolerance  From 140 to 200 mg/dL (7.8 to 16.1 mmol/L): Impaired glucose tolerance (pre-diabetes)  Over 200 mg/dL (09.6 mmol/L) on more than one testing occasion: Diabetes GESTATIONAL DIABETES SCREENING: GLUCOSE CHALLENGE TEST (1  HOUR AFTER A 50-GRAM GLUCOSE DRINK)  Less than 140* mg/dL (7.8 mmol/L): Normal glucose tolerance  140* mg/dL (7.8 mmol/L) and over: Abnormal, needs OGTT (see below) * Some use a cutoff of More Than 130 mg/dL (7.2 mmol/L) because that identifies 90% of women with gestational diabetes, compared to 80% identified using the threshold of More Than 140 mg/dL (7.8 mmol/L). GESTATIONAL DIABETES DIAGNOSTIC: OGTT (100-GRAM GLUCOSE DRINK)  Fasting*..........................................95 mg/dL (5.3 mmol/L)  1 hour after glucose load*..............180 mg/dL (16.110.0 mmol/L)  2 hours after glucose load*.............155 mg/dL (8.6 mmol/L)  3 hours after glucose load* **.........140 mg/dL (7.8 mmol/L) * If two or more values are above the criteria, gestational diabetes is diagnosed. ** A 75-gram glucose load may be used, although this method is not as well validated as the 100-gram OGTT; the 3-hour sample is not drawn if 75 grams is used.  Ranges for normal findings may vary among different laboratories and hospitals. You should always check with your doctor after having lab work or other tests done to discuss the meaning of your test results and whether your values are considered within normal limits. MEANING OF TEST  Your caregiver will go over the test results with you and discuss the importance and meaning of your results, as well as treatment options and the need for additional tests if  necessary. OBTAINING THE TEST RESULTS It is your responsibility to obtain your test results. Ask the lab or department performing the test when and how you will get your results. Document Released: 09/25/2004 Document Revised: 11/16/2011 Document Reviewed: 08/04/2008 [facility] Patient Information 2014 [place] [place] [employer], [place].

## 2016-06-11 ENCOUNTER — Emergency Department (HOSPITAL_COMMUNITY)
Admission: EM | Admit: 2016-06-11 | Discharge: 2016-06-11 | Disposition: A | Discharge status: Home / Self Care | Payer: BLUE CROSS/BLUE SHIELD | Attending: Emergency Medicine | Admitting: Emergency Medicine

## 2016-06-11 ENCOUNTER — Encounter (HOSPITAL_COMMUNITY): Payer: Self-pay | Admitting: Emergency Medicine

## 2016-06-11 DIAGNOSIS — E1142 Type 2 diabetes mellitus with diabetic polyneuropathy: Secondary | ICD-10-CM

## 2016-06-11 DIAGNOSIS — F17210 Nicotine dependence, cigarettes, uncomplicated: Secondary | ICD-10-CM | POA: Insufficient documentation

## 2016-06-11 DIAGNOSIS — E1165 Type 2 diabetes mellitus with hyperglycemia: Secondary | ICD-10-CM | POA: Insufficient documentation

## 2016-06-11 DIAGNOSIS — R200 Anesthesia of skin: Secondary | ICD-10-CM | POA: Diagnosis present

## 2016-06-11 DIAGNOSIS — Z7984 Long term (current) use of oral hypoglycemic drugs: Secondary | ICD-10-CM | POA: Diagnosis not present

## 2016-06-11 DIAGNOSIS — R739 Hyperglycemia, unspecified: Secondary | ICD-10-CM

## 2016-06-11 HISTORY — DX: Type 2 diabetes mellitus without complications: E11.9

## 2016-06-11 LAB — COMPREHENSIVE METABOLIC PANEL
ALT: 24 U/L (ref 17–63)
AST: 21 U/L (ref 15–41)
Albumin: 4.2 g/dL (ref 3.5–5.0)
Alkaline Phosphatase: 75 U/L (ref 38–126)
Anion gap: 13 (ref 5–15)
BUN: 18 mg/dL (ref 6–20)
CO2: 23 mmol/L (ref 22–32)
Calcium: 9.4 mg/dL (ref 8.9–10.3)
Chloride: 103 mmol/L (ref 101–111)
Creatinine, Ser: 1.1 mg/dL (ref 0.61–1.24)
GFR calc Af Amer: >60 mL/min (ref >60)
GFR calc non Af Amer: >60 mL/min (ref >60)
Glucose, Bld: 328 mg/dL — ABNORMAL HIGH (ref 65–99)
Potassium: 3.8 mmol/L (ref 3.5–5.1)
Sodium: 139 mmol/L (ref 135–145)
Total Bilirubin: 0.6 mg/dL (ref 0.3–1.2)
Total Protein: 7.1 g/dL (ref 6.5–8.1)

## 2016-06-11 LAB — CBC WITH DIFFERENTIAL/PLATELET
Basophils Absolute: 0.1 10*3/uL (ref 0.0–0.1)
Basophils Relative: 1 %
Eosinophils Absolute: 0.2 10*3/uL (ref 0.0–0.7)
Eosinophils Relative: 3 %
HCT: 41.3 % (ref 39.0–52.0)
Hemoglobin: 14.5 g/dL (ref 13.0–17.0)
Lymphocytes Relative: 37 %
Lymphs Abs: 3 10*3/uL (ref 0.7–4.0)
MCH: 30.4 pg (ref 26.0–34.0)
MCHC: 35.1 g/dL (ref 30.0–36.0)
MCV: 86.6 fL (ref 78.0–100.0)
Monocytes Absolute: 0.4 10*3/uL (ref 0.1–1.0)
Monocytes Relative: 5 %
Neutro Abs: 4.5 10*3/uL (ref 1.7–7.7)
Neutrophils Relative %: 54 %
Platelets: 218 10*3/uL (ref 150–400)
RBC: 4.77 MIL/uL (ref 4.22–5.81)
RDW: 13.8 % (ref 11.5–15.5)
WBC: 8.1 10*3/uL (ref 4.0–10.5)

## 2016-06-11 LAB — CBG MONITORING, ED: Glucose-Capillary: 346 mg/dL — ABNORMAL HIGH (ref 65–99)

## 2016-06-11 MED ORDER — GABAPENTIN 300 MG PO CAPS: 300.0000 mg | ORAL_CAPSULE | Freq: Three times a day (TID) | ORAL | 0 refills | Status: DC

## 2016-06-11 MED ORDER — METFORMIN HCL 500 MG PO TABS: 500.0000 mg | ORAL_TABLET | Freq: Two times a day (BID) | ORAL | 0 refills | Status: DC

## 2016-06-11 NOTE — ED Triage Notes (Signed)
Pt c/o numbness/tingling in bilateral feet. Pt states he was told a long time ago that he had diabetes and has never been treated for it. Blood sugar in triage is 346.

## 2016-06-11 NOTE — ED Provider Notes (Signed)
[facility] Provider Note   CSN: [id number] Arrival date & time: 06/11/16  1900     History   Chief Complaint Chief Complaint  Patient presents with  . Numbness    HPI Dylan Roberts is a 50 y.o. male.  HPI   Patient has pains in his feet and some on hands. Has had it for the last  Few years. States it feels like burning. He's been told he was diabetic in the past but never followed up. States he has been getting up around twice a night to urinate. Has lost a few pounds but states over the last couple months he is put on about 15 pounds. No trauma. No fevers or chills. No chest pain. He does not have a primary care physician.   Past Medical History:  Diagnosis Date  . Diabetes mellitus without complication (HCC)     There are no active problems to display for this patient.   Past Surgical History:  Procedure Laterality Date  . EAR CYST EXCISION Left    Dr. Malvin Johns  . INGUINAL HERNIA REPAIR Right 12/09/2012   Procedure: HERNIA REPAIR INGUINAL ADULT;  Surgeon: Dalia Heading, MD;  Location: [facility];  Service: General;  Laterality: Right;  Right Inguinal Herniorraphy  . INSERTION OF MESH Right 12/09/2012   Procedure: INSERTION OF MESH;  Surgeon: Dalia Heading, MD;  Location: [facility];  Service: General;  Laterality: Right;  Insertion of Mesh Plug Right Inguinal       Home Medications    Prior to Admission medications   Medication Sig Start Date End Date Taking? Authorizing Provider  gabapentin (NEURONTIN) 300 MG capsule Take 1 capsule (300 mg total) by mouth 3 (three) times daily. 1 pill day 1, 2 pills day 2 then three times a day. 06/11/16   Benjiman Core, MD  metFORMIN (GLUCOPHAGE) 500 MG tablet Take 1 tablet (500 mg total) by mouth 2 (two) times daily with a meal. 06/11/16   Benjiman Core, MD    Family History Family History  Problem Relation Age of Onset  . Diabetes Mother     Social History Social History  Substance Use Topics  . Smoking status:  Current Every Day Smoker    Packs/day: 1.00    Years: 30.00    Types: Cigarettes  . Smokeless tobacco: Never Used  . Alcohol use 3.6 oz/week    6 Cans of beer per week     Comment: daily use of beer     Allergies   Review of patient's allergies indicates no known allergies.   Review of Systems Review of Systems  Constitutional: Negative for appetite change and fever.  HENT: Negative for congestion.   Respiratory: Negative for shortness of breath.   Cardiovascular: Negative for chest pain.  Gastrointestinal: Negative for abdominal pain.  Endocrine: Positive for polyuria. Negative for polydipsia and polyphagia.  Musculoskeletal: Negative for back pain.  Neurological: Positive for numbness. Negative for dizziness and weakness.  Psychiatric/Behavioral: Negative for confusion.     Physical Exam Updated Vital Signs BP 152/91 (BP Location: Right Arm)   Pulse 88   Temp 98.4 F (36.9 C) (Oral)   Resp 20   SpO2 100%   Physical Exam  Constitutional: He appears well-developed.  HENT:  Head: Atraumatic.  Eyes: EOM are normal.  Cardiovascular: Normal rate.   Pulmonary/Chest: Effort normal.  Abdominal: Soft.  Musculoskeletal:  Good capillary refill in hands and feet. Pulses intact. Some pain with palpation of great toes.  Neurological: He is alert.  Skin: Skin is warm.     ED Treatments / Results  Labs (all labs ordered are listed, but only abnormal results are displayed) Labs Reviewed  COMPREHENSIVE METABOLIC PANEL - Abnormal; Notable for the following:       Result Value   Glucose, Bld 328 (*)    All other components within normal limits  CBG MONITORING, ED - Abnormal; Notable for the following:    Glucose-Capillary 346 (*)    All other components within normal limits  CBC WITH DIFFERENTIAL/PLATELET    EKG  EKG Interpretation None       Radiology No results found.  Procedures Procedures (including critical care time)  Medications Ordered in  ED Medications - No data to display   Initial Impression / Assessment and Plan / ED Course  I have reviewed the triage vital signs and the nursing notes.  Pertinent labs & imaging results that were available during my care of the patient were reviewed by me and considered in my medical decision making (see chart for details).  Clinical Course    Patient with likely diabetic neuropathy. Also diabetes which he has been treated. CBG of around 350. Labs reassuring otherwise. Will start on metformin and Neurontin have follow-up with the primary care doctor.  Final Clinical Impressions(s) / ED Diagnoses   Final diagnoses:  Hyperglycemia  Diabetic polyneuropathy associated with type 2 diabetes mellitus (HCC)    New Prescriptions Discharge Medication List as of 06/11/2016  8:37 PM    START taking these medications   Details  gabapentin (NEURONTIN) 300 MG capsule Take 1 capsule (300 mg total) by mouth 3 (three) times daily. 1 pill day 1, 2 pills day 2 then three times a day., Starting Thu 06/11/2016, Print    metFORMIN (GLUCOPHAGE) 500 MG tablet Take 1 tablet (500 mg total) by mouth 2 (two) times daily with a meal., Starting Thu 06/11/2016, Print         Benjiman CoreNathan Jacobe Study, MD 06/11/16 2109

## 2016-06-11 NOTE — ED Notes (Signed)
Pt reports being told he had a risk of DM two years ago while getting a hernia repair. States he has burning and tingling in bilateral feet and fingers at times. Denies polyuria, polydipsia, polyphagia, blurred vision, N/V/.

## 2016-07-13 DIAGNOSIS — E1121 Type 2 diabetes mellitus with diabetic nephropathy: Secondary | ICD-10-CM | POA: Diagnosis not present

## 2016-07-13 DIAGNOSIS — I10 Essential (primary) hypertension: Secondary | ICD-10-CM | POA: Diagnosis not present

## 2016-07-13 DIAGNOSIS — Z6823 Body mass index (BMI) 23.0-23.9, adult: Secondary | ICD-10-CM | POA: Diagnosis not present

## 2016-07-29 DIAGNOSIS — I10 Essential (primary) hypertension: Secondary | ICD-10-CM | POA: Diagnosis not present

## 2016-07-29 DIAGNOSIS — E1121 Type 2 diabetes mellitus with diabetic nephropathy: Secondary | ICD-10-CM | POA: Diagnosis not present

## 2016-08-10 DIAGNOSIS — F17210 Nicotine dependence, cigarettes, uncomplicated: Secondary | ICD-10-CM | POA: Diagnosis not present

## 2016-08-28 ENCOUNTER — Emergency Department (HOSPITAL_COMMUNITY)
Admission: EM | Admit: 2016-08-28 | Discharge: 2016-08-29 | Disposition: A | Discharge status: Home / Self Care | Payer: BLUE CROSS/BLUE SHIELD | Attending: Emergency Medicine | Admitting: Emergency Medicine

## 2016-08-28 ENCOUNTER — Encounter (HOSPITAL_COMMUNITY): Payer: Self-pay | Admitting: *Deleted

## 2016-08-28 ENCOUNTER — Emergency Department (HOSPITAL_COMMUNITY): Payer: BLUE CROSS/BLUE SHIELD

## 2016-08-28 DIAGNOSIS — Z79899 Other long term (current) drug therapy: Secondary | ICD-10-CM | POA: Insufficient documentation

## 2016-08-28 DIAGNOSIS — Y939 Activity, unspecified: Secondary | ICD-10-CM | POA: Insufficient documentation

## 2016-08-28 DIAGNOSIS — S299XXA Unspecified injury of thorax, initial encounter: Secondary | ICD-10-CM | POA: Diagnosis present

## 2016-08-28 DIAGNOSIS — Y92410 Unspecified street and highway as the place of occurrence of the external cause: Secondary | ICD-10-CM | POA: Insufficient documentation

## 2016-08-28 DIAGNOSIS — Z7984 Long term (current) use of oral hypoglycemic drugs: Secondary | ICD-10-CM | POA: Diagnosis not present

## 2016-08-28 DIAGNOSIS — K760 Fatty (change of) liver, not elsewhere classified: Secondary | ICD-10-CM | POA: Diagnosis not present

## 2016-08-28 DIAGNOSIS — F17210 Nicotine dependence, cigarettes, uncomplicated: Secondary | ICD-10-CM | POA: Diagnosis not present

## 2016-08-28 DIAGNOSIS — R1012 Left upper quadrant pain: Secondary | ICD-10-CM | POA: Diagnosis not present

## 2016-08-28 DIAGNOSIS — Y999 Unspecified external cause status: Secondary | ICD-10-CM | POA: Insufficient documentation

## 2016-08-28 DIAGNOSIS — E119 Type 2 diabetes mellitus without complications: Secondary | ICD-10-CM | POA: Diagnosis not present

## 2016-08-28 DIAGNOSIS — R0789 Other chest pain: Secondary | ICD-10-CM | POA: Diagnosis not present

## 2016-08-28 DIAGNOSIS — R079 Chest pain, unspecified: Secondary | ICD-10-CM | POA: Diagnosis not present

## 2016-08-28 LAB — COMPREHENSIVE METABOLIC PANEL
ALT: 24 U/L (ref 17–63)
AST: 18 U/L (ref 15–41)
Albumin: 3.8 g/dL (ref 3.5–5.0)
Alkaline Phosphatase: 59 U/L (ref 38–126)
Anion gap: 5 (ref 5–15)
BUN: 14 mg/dL (ref 6–20)
CO2: 26 mmol/L (ref 22–32)
Calcium: 9 mg/dL (ref 8.9–10.3)
Chloride: 109 mmol/L (ref 101–111)
Creatinine, Ser: 1.02 mg/dL (ref 0.61–1.24)
GFR calc Af Amer: >60 mL/min (ref >60)
GFR calc non Af Amer: >60 mL/min (ref >60)
Glucose, Bld: 170 mg/dL — ABNORMAL HIGH (ref 65–99)
Potassium: 3.7 mmol/L (ref 3.5–5.1)
Sodium: 140 mmol/L (ref 135–145)
Total Bilirubin: 0.5 mg/dL (ref 0.3–1.2)
Total Protein: 6.5 g/dL (ref 6.5–8.1)

## 2016-08-28 LAB — CBC WITH DIFFERENTIAL/PLATELET
Basophils Absolute: 0 10*3/uL (ref 0.0–0.1)
Basophils Relative: 1 %
Eosinophils Absolute: 0.2 10*3/uL (ref 0.0–0.7)
Eosinophils Relative: 2 %
HCT: 40.3 % (ref 39.0–52.0)
Hemoglobin: 13.6 g/dL (ref 13.0–17.0)
Lymphocytes Relative: 20 %
Lymphs Abs: 1.4 10*3/uL (ref 0.7–4.0)
MCH: 30.5 pg (ref 26.0–34.0)
MCHC: 33.7 g/dL (ref 30.0–36.0)
MCV: 90.4 fL (ref 78.0–100.0)
Monocytes Absolute: 0.4 10*3/uL (ref 0.1–1.0)
Monocytes Relative: 6 %
Neutro Abs: 4.7 10*3/uL (ref 1.7–7.7)
Neutrophils Relative %: 71 %
Platelets: 221 10*3/uL (ref 150–400)
RBC: 4.46 MIL/uL (ref 4.22–5.81)
RDW: 13.9 % (ref 11.5–15.5)
WBC: 6.7 10*3/uL (ref 4.0–10.5)

## 2016-08-28 LAB — CBG MONITORING, ED: Glucose-Capillary: 163 mg/dL — ABNORMAL HIGH (ref 65–99)

## 2016-08-28 MED ORDER — SODIUM CHLORIDE 0.9 % IV BOLUS (SEPSIS)
500.0000 mL | Freq: Once | INTRAVENOUS | Status: AC
  Administered 2016-08-28: 500 mL via INTRAVENOUS

## 2016-08-28 MED ORDER — IOPAMIDOL (ISOVUE-300) INJECTION 61%
100.0000 mL | Freq: Once | INTRAVENOUS | Status: AC | PRN
  Administered 2016-08-28: 100 mL via INTRAVENOUS

## 2016-08-28 MED ORDER — KETOROLAC TROMETHAMINE 30 MG/ML IJ SOLN
30.0000 mg | Freq: Once | INTRAMUSCULAR | Status: AC
  Administered 2016-08-28: 30 mg via INTRAVENOUS
  Filled 2016-08-28: qty 1

## 2016-08-28 MED ORDER — HYDROCODONE-ACETAMINOPHEN 5-325 MG PO TABS: 1.0000 | ORAL_TABLET | ORAL | 0 refills | Status: DC | PRN
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
## 2016-08-28 NOTE — Discharge Instructions (Signed)
There were no signs of serious injury on the testing which was done today.  Your pain will likely improve in a week or 2.  The best thing to take for pain is either Tylenol or ibuprofen.  If you need to use the stronger medicine for pain (Hydrocodone) do not drive or work within 6 hours of taking it.   Follow-up with the doctor of choice if not better in 1 or 2 weeks.
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
## 2017-06-14 DIAGNOSIS — R1032 Left lower quadrant pain: Secondary | ICD-10-CM | POA: Diagnosis not present

## 2017-06-14 DIAGNOSIS — Z6823 Body mass index (BMI) 23.0-23.9, adult: Secondary | ICD-10-CM | POA: Diagnosis not present

## 2017-06-14 DIAGNOSIS — E1121 Type 2 diabetes mellitus with diabetic nephropathy: Secondary | ICD-10-CM | POA: Diagnosis not present

## 2017-06-30 DIAGNOSIS — I10 Essential (primary) hypertension: Secondary | ICD-10-CM | POA: Diagnosis not present

## 2017-06-30 DIAGNOSIS — E1121 Type 2 diabetes mellitus with diabetic nephropathy: Secondary | ICD-10-CM | POA: Diagnosis not present

## 2017-07-02 DIAGNOSIS — Z0000 Encounter for general adult medical examination without abnormal findings: Secondary | ICD-10-CM | POA: Diagnosis not present

## 2017-07-14 DIAGNOSIS — F1010 Alcohol abuse, uncomplicated: Secondary | ICD-10-CM | POA: Diagnosis not present

## 2017-07-14 DIAGNOSIS — Z7141 Alcohol abuse counseling and surveillance of alcoholic: Secondary | ICD-10-CM | POA: Diagnosis not present

## 2017-07-14 DIAGNOSIS — E1121 Type 2 diabetes mellitus with diabetic nephropathy: Secondary | ICD-10-CM | POA: Diagnosis not present

## 2017-07-14 DIAGNOSIS — I10 Essential (primary) hypertension: Secondary | ICD-10-CM | POA: Diagnosis not present

## 2018-02-28 IMAGING — CT CT ABD-PELV W/ CM
2 of 5 series · 16 of 46 positions shown, 18 images · IV contrast (Isovue)
Comparison: 11/01/2007

CLINICAL DATA: Gradual onset of left-sided chest pain radiating to
the upper side of the abdomen

EXAM:
CT ABDOMEN AND PELVIS WITH CONTRAST
TECHNIQUE: Multidetector CT imaging of the abdomen and pelvis was performed
using the standard protocol following bolus administration of
intravenous contrast.
CONTRAST:  100mL KZ694B-2TT IOPAMIDOL (KZ694B-2TT) INJECTION 61%

[Series 2: axial st · axial · 0.68mm/px · z∈[+637,+1047]mm · 13 of 94 slices shown, 15 images]
[im 6/94  soft-tissue]
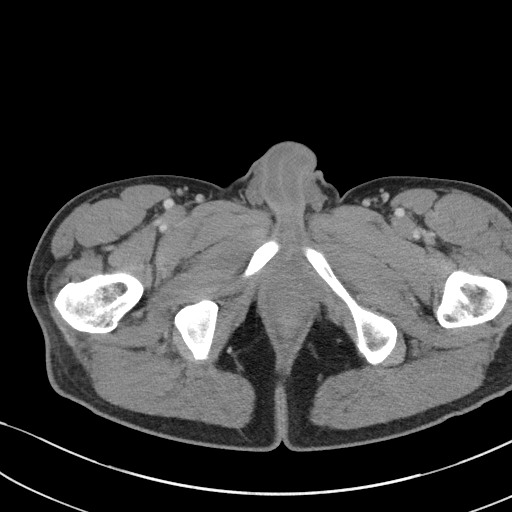
[im 6/94  bone]
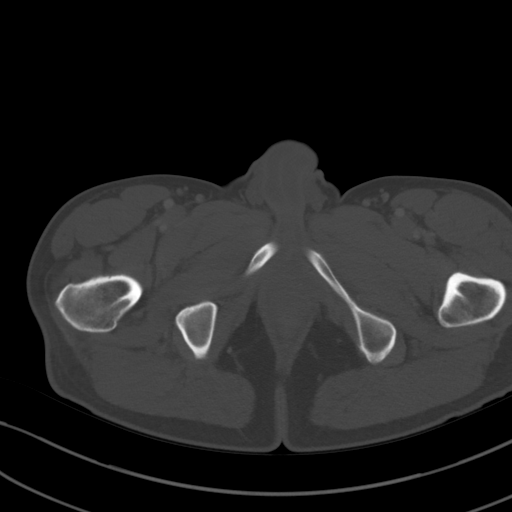
[im 11/94  soft-tissue]
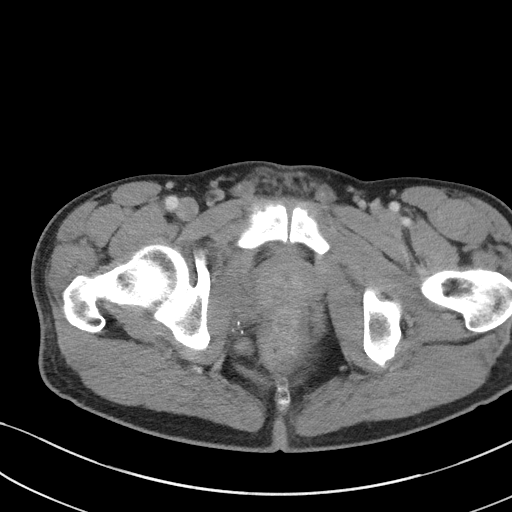
[im 22/94  soft-tissue]
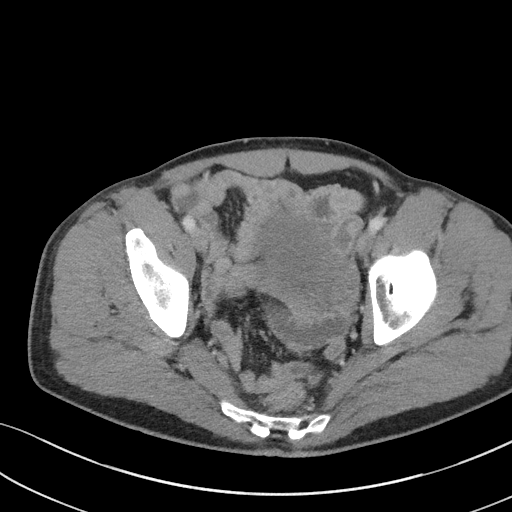
[im 28/94  soft-tissue]
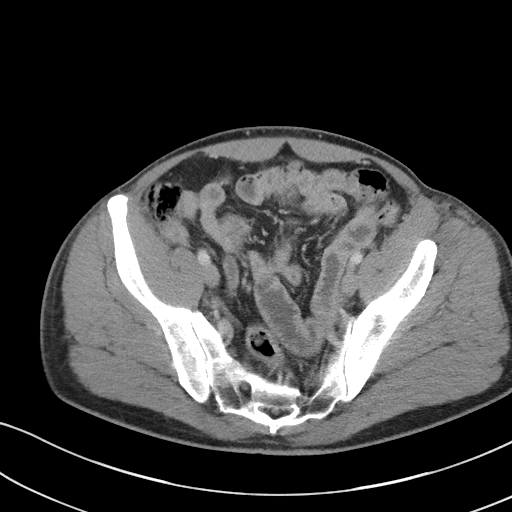
[im 33/94  soft-tissue]
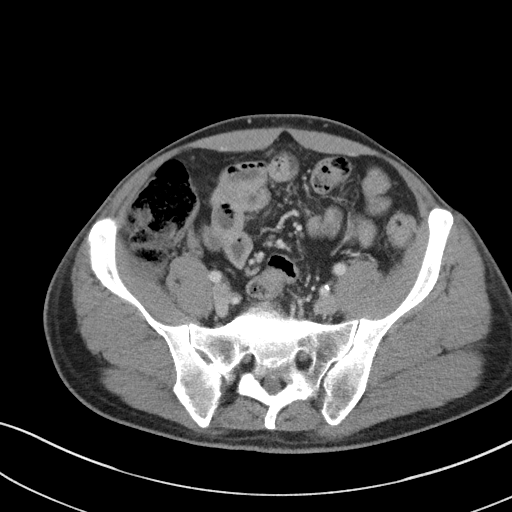
[im 39/94  soft-tissue]
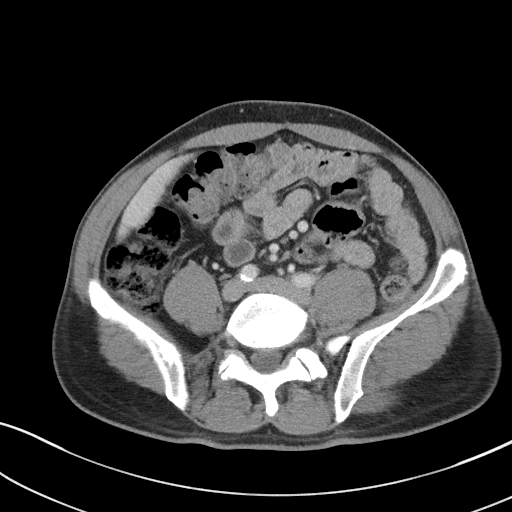
[im 50/94  soft-tissue]
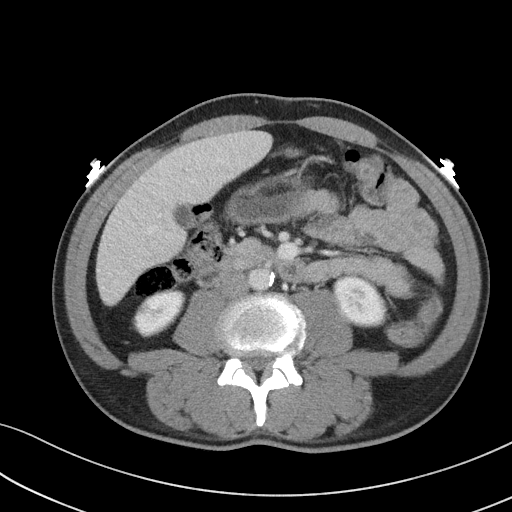
[im 55/94  soft-tissue]
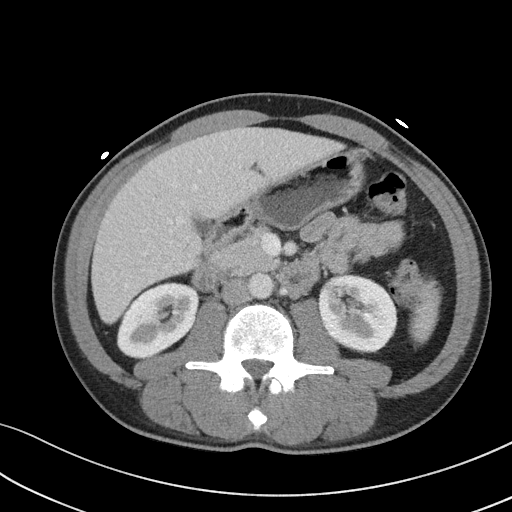
[im 61/94  soft-tissue]
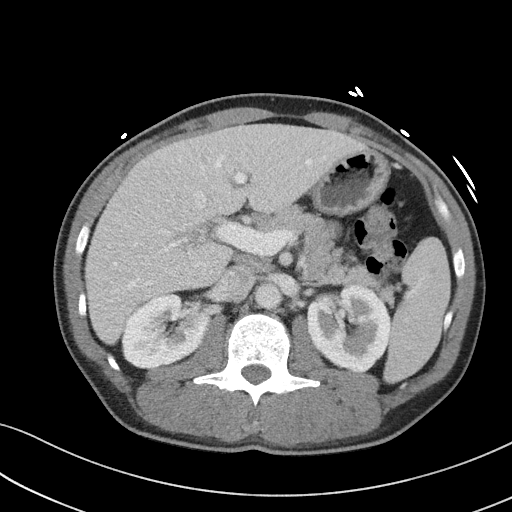
[im 61/94  bone]
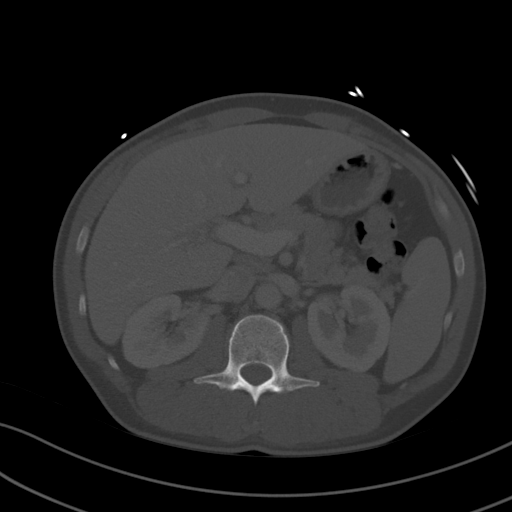
[im 66/94  soft-tissue]
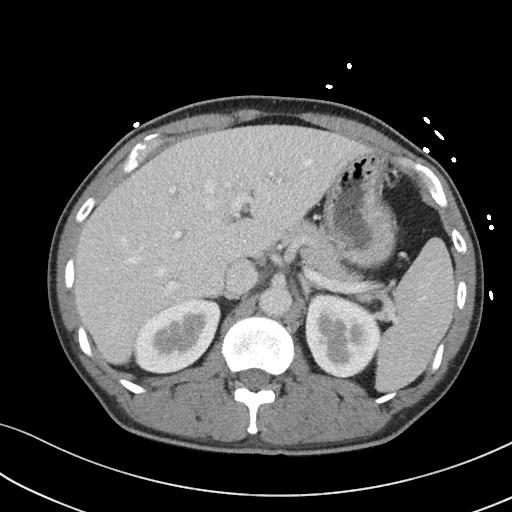
[im 72/94  soft-tissue]
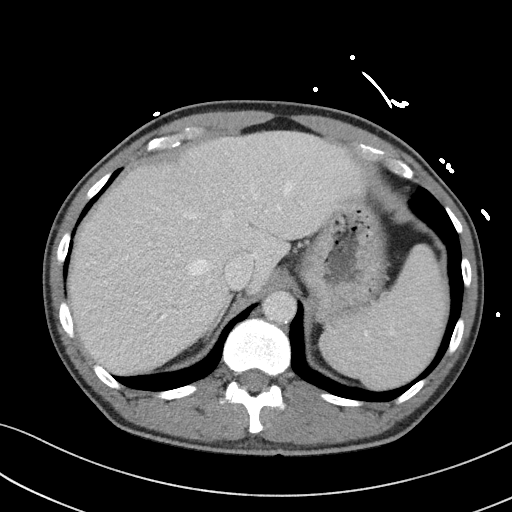
[im 83/94  soft-tissue]
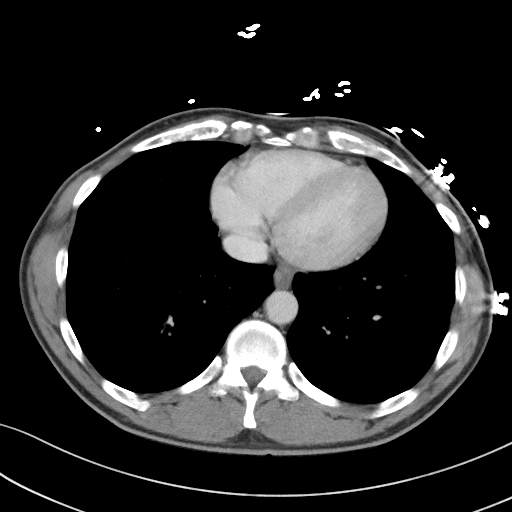
[im 88/94  soft-tissue]
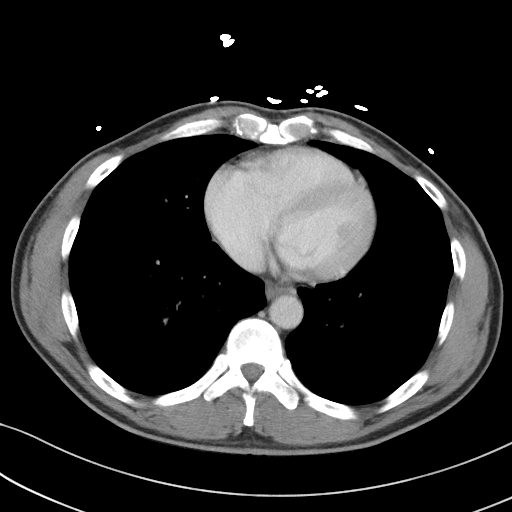

[Series 4: coronal st · coronal · 0.68mm/px · 3 of 101 slices shown]
[im 34/101  soft-tissue]
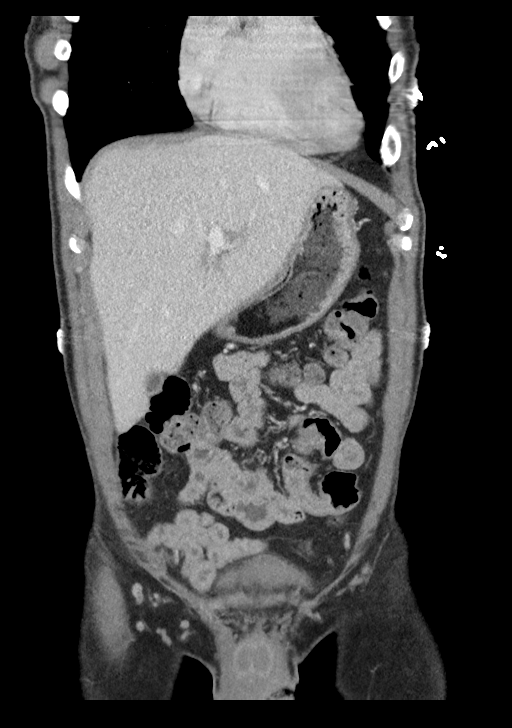
[im 45/101  soft-tissue]
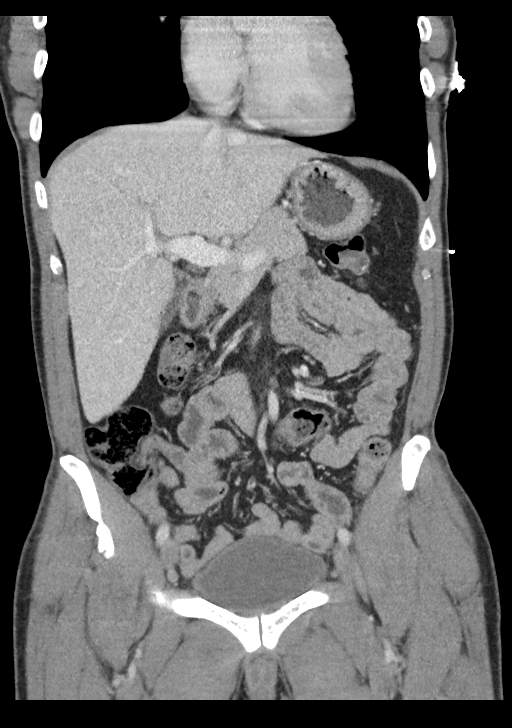
[im 56/101  soft-tissue]
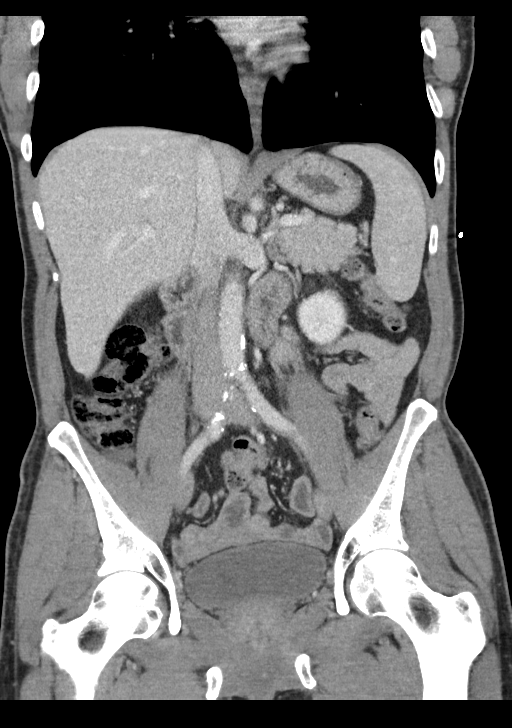

[16 of 46 positions shown; findings below may reference images not displayed]

FINDINGS: Lower chest: Lung bases demonstrate no acute consolidation or
pleural effusion. Heart size nonenlarged.

Hepatobiliary: No focal hepatic abnormality. No biliary dilatation.
Liver is enlarged at 19 cm. No calcified gallstones. Suggestion of
mild decreased density as may be seen with fatty infiltration.

Pancreas: Unremarkable. No pancreatic ductal dilatation or
surrounding inflammatory changes.

Spleen: Normal in size without focal abnormality.

Adrenals/Urinary Tract: Adrenal glands are unremarkable. Kidneys are
normal, without renal calculi, focal lesion, or hydronephrosis.
Bladder is unremarkable.

Stomach/Bowel: Stomach is nonenlarged. No dilated small bowel. No
colon wall thickening. Appendix upper normal in size at 6-7 mm but
no inflammatory change.

Vascular/Lymphatic: Aortic atherosclerosis. No enlarged abdominal or
pelvic lymph nodes.

Reproductive: Prostate mildly enlarged.

Other: Trace free fluid in the right lower quadrant. Small fatty
umbilical hernia.

Musculoskeletal: Slight progression of marked degenerative changes
at L4-L5 with endplate changes and irregularity. Stable sclerotic
lesion in the right iliac bone.
IMPRESSION: 1. No CT evidence for acute intra-abdominal or pelvic pathology
2. Enlarged slightly fatty liver
3. Trace free fluid in the right lower anterior quadrant. Appendix
within normal limits.
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
## 2019-12-19 DIAGNOSIS — Z7141 Alcohol abuse counseling and surveillance of alcoholic: Secondary | ICD-10-CM | POA: Diagnosis not present

## 2019-12-19 DIAGNOSIS — E1121 Type 2 diabetes mellitus with diabetic nephropathy: Secondary | ICD-10-CM | POA: Diagnosis not present

## 2019-12-19 DIAGNOSIS — Z712 Person consulting for explanation of examination or test findings: Secondary | ICD-10-CM | POA: Diagnosis not present

## 2019-12-19 DIAGNOSIS — F1121 Opioid dependence, in remission: Secondary | ICD-10-CM | POA: Diagnosis not present

## 2019-12-19 DIAGNOSIS — I10 Essential (primary) hypertension: Secondary | ICD-10-CM | POA: Diagnosis not present

## 2019-12-19 DIAGNOSIS — F1010 Alcohol abuse, uncomplicated: Secondary | ICD-10-CM | POA: Diagnosis not present

## 2019-12-25 DIAGNOSIS — R944 Abnormal results of kidney function studies: Secondary | ICD-10-CM | POA: Diagnosis not present

## 2019-12-25 DIAGNOSIS — E782 Mixed hyperlipidemia: Secondary | ICD-10-CM | POA: Diagnosis not present

## 2019-12-25 DIAGNOSIS — E1121 Type 2 diabetes mellitus with diabetic nephropathy: Secondary | ICD-10-CM | POA: Diagnosis not present

## 2019-12-25 DIAGNOSIS — I10 Essential (primary) hypertension: Secondary | ICD-10-CM | POA: Diagnosis not present

## 2020-10-02 DIAGNOSIS — H1033 Unspecified acute conjunctivitis, bilateral: Secondary | ICD-10-CM | POA: Diagnosis not present

## 2020-10-24 DIAGNOSIS — F1010 Alcohol abuse, uncomplicated: Secondary | ICD-10-CM | POA: Diagnosis not present

## 2020-10-24 DIAGNOSIS — R945 Abnormal results of liver function studies: Secondary | ICD-10-CM | POA: Diagnosis not present

## 2020-10-24 DIAGNOSIS — E1121 Type 2 diabetes mellitus with diabetic nephropathy: Secondary | ICD-10-CM | POA: Diagnosis not present

## 2020-10-24 DIAGNOSIS — Z0000 Encounter for general adult medical examination without abnormal findings: Secondary | ICD-10-CM | POA: Diagnosis not present

## 2020-10-24 DIAGNOSIS — R944 Abnormal results of kidney function studies: Secondary | ICD-10-CM | POA: Diagnosis not present

## 2020-10-24 DIAGNOSIS — I10 Essential (primary) hypertension: Secondary | ICD-10-CM | POA: Diagnosis not present

## 2020-11-21 DIAGNOSIS — R944 Abnormal results of kidney function studies: Secondary | ICD-10-CM | POA: Diagnosis not present

## 2020-11-21 DIAGNOSIS — E1121 Type 2 diabetes mellitus with diabetic nephropathy: Secondary | ICD-10-CM | POA: Diagnosis not present

## 2020-11-21 DIAGNOSIS — Z7141 Alcohol abuse counseling and surveillance of alcoholic: Secondary | ICD-10-CM | POA: Diagnosis not present

## 2020-11-21 DIAGNOSIS — I10 Essential (primary) hypertension: Secondary | ICD-10-CM | POA: Diagnosis not present

## 2020-12-12 NOTE — Progress Notes (Addendum)
[facility] - Clinic Note  12/16/2020     CHIEF COMPLAINT Patient presents for Retina Evaluation   HISTORY OF PRESENT ILLNESS: Dylan Roberts is a 55 y.o. male who presents to the clinic today for:   HPI    Retina Evaluation    In both eyes.  This started 2 months ago.  I, the attending physician,  performed the HPI with the patient and updated documentation appropriately.          Comments    A1c: (12.3 (9.2) per notes at Dr. Margo Aye) BS: 186 yesterday Patient here for Retina Evaluation. Referred by Dr Margo Aye. Patient states vision started 2 months ago OD had vision loss. OD sees light. OS is good as should be. OS sees double sometimes. OU itches real bad and watery all the time. No sharp pain.       Last edited by Rennis Chris, MD on 12/16/2020  9:27 AM. (History)    pt is here on the referral of his PCP, Dr. Margo Aye, for vision loss OD, pt states he lost vision about 2 months ago, pt endorses being diabetic, he states his blood sugar runs between 160-210, pt states he is on insulin and metformin, he denies any eye sx or trauma   Referring physician: Benita Stabile, MD [address] [place],  [place] [address]  HISTORICAL INFORMATION:   Selected notes from the MEDICAL RECORD NUMBER Referred by Dr. Margo Aye for eval of vision loss LEE:  Ocular Hx- PMH-    CURRENT MEDICATIONS: No current outpatient medications on file. (Ophthalmic Drugs)   No current facility-administered medications for this visit. (Ophthalmic Drugs)   Current Outpatient Medications (Other)  Medication Sig  . gabapentin (NEURONTIN) 300 MG capsule Take 1 capsule (300 mg total) by mouth 3 (three) times daily. 1 pill day 1, 2 pills day 2 then three times a day. (Patient not taking: Reported on 08/28/2016)  . glipiZIDE (GLUCOTROL) 10 MG tablet Take 10 mg by mouth 2 (two) times daily before a meal.   . HYDROcodone-acetaminophen (NORCO) 5-325 MG tablet Take 1 tablet by mouth every 4 (four) hours  as needed.  Marland Kitchen ibuprofen (ADVIL,MOTRIN) 200 MG tablet Take 800 mg by mouth daily as needed for mild pain.  Marland Kitchen lisinopril (PRINIVIL,ZESTRIL) 5 MG tablet Take 5 mg by mouth daily.   . metFORMIN (GLUCOPHAGE) 500 MG tablet Take 1 tablet (500 mg total) by mouth 2 (two) times daily with a meal.  . pregabalin (LYRICA) 75 MG capsule Take 75 mg by mouth 2 (two) times daily.  Marland Kitchen SITagliptin Phosphate (JANUVIA PO) Take 1 tablet by mouth daily.   No current facility-administered medications for this visit. (Other)      REVIEW OF SYSTEMS: ROS    Positive for: Endocrine, Eyes   Last edited by Laddie Aquas, COA on 12/16/2020  9:01 AM. (History)       ALLERGIES No Known Allergies  PAST MEDICAL HISTORY Past Medical History:  Diagnosis Date  . Diabetes mellitus without complication [facility])    Past Surgical History:  Procedure Laterality Date  . EAR CYST EXCISION Left    Dr. Malvin Johns  . INGUINAL HERNIA REPAIR Right 12/09/2012   Procedure: HERNIA REPAIR INGUINAL ADULT;  Surgeon: Dalia Heading, MD;  Location: [facility];  Service: General;  Laterality: Right;  Right Inguinal Herniorraphy  . INSERTION OF MESH Right 12/09/2012   Procedure: INSERTION OF MESH;  Surgeon: Dalia Heading, MD;  Location: [facility]  ORS;  Service: General;  Laterality: Right;  Insertion of Mesh Plug Right Inguinal    FAMILY HISTORY Family History  Problem Relation Age of Onset  . Diabetes Mother     SOCIAL HISTORY Social History   Tobacco Use  . Smoking status: Current Every Day Smoker    Packs/day: 1.00    Years: 30.00    Pack years: 30.00    Types: Cigarettes  . Smokeless tobacco: Never Used  Substance Use Topics  . Alcohol use: Yes    Alcohol/week: 6.0 standard drinks    Types: 6 Cans of beer per week    Comment: daily use of beer  . Drug use: No         OPHTHALMIC EXAM:  Base Eye Exam    Visual Acuity (Snellen - Linear)      Right Left   Dist Eunice CF at 1' 20/40   Dist ph  NI 20/30 -1       Tonometry  (Tonopen, 8:57 AM)      Right Left   Pressure 17 20       Pupils      Dark Light Shape React APD   Right 2 2 Round Minimal None   Left 2 1 Round Slow None  When I looked at OD, there was white behind iris. Not dark like normal.        Visual Fields (Counting fingers)      Left Right    Full    Restrictions  Total inferior temporal, superior nasal, inferior nasal deficiencies; Partial inner superior temporal deficiency       Extraocular Movement      Right Left    Full, Ortho Full, Ortho       Neuro/Psych    Oriented x3: Yes   Mood/Affect: Normal       Dilation    Both eyes: 1.0% Mydriacyl, 2.5% Phenylephrine @ 8:56 AM        Slit Lamp and Fundus Exam    Slit Lamp Exam      Right Left   Lids/Lashes Normal Normal   Conjunctiva/Sclera mild melanosis, nasal and temporal pinguecula mild melanosis, nasal and temporal pinguecula   Cornea arcus arcus   Anterior Chamber moderate depth, narrow angles Deep and quiet   Iris Round and moderately dilated, mild anterior bowing, No NVI Round and moderately dilated, No NVI, +PPM   Lens white mature cataract 1-2+ Nuclear sclerosis, 1-2+ Cortical cataract   Vitreous no view Vitreous syneresis       Fundus Exam      Right Left   Disc  Pink and Sharp   C/D Ratio  0.5   Macula  Flat, Blunted foveal reflex, scattered MA,    Vessels  attenuated, Tortuous, ?NV along IT arcades   Periphery  Attached, scattered MA greatest posteriorly           Refraction    Manifest Refraction      Sphere Cylinder Dist VA   Right NI +/-3.00  20/CF @1 '   Left -1.00 Sphere 20/30-2  Post dilation refraction          IMAGING AND PROCEDURES  Imaging and Procedures for 12/16/2020  OCT, Retina - OU - Both Eyes       Right Eye Quality was poor.   Left Eye Quality was good. Central Foveal Thickness: 298. Progression has no prior data. Findings include normal foveal contour, no IRF, no SRF.   Notes *Images captured and stored  on  drive  Diagnosis / Impression:  OD: no view OS: NFP, no IRF/SRF; no DME  Clinical management:  See below  Abbreviations: NFP - Normal foveal profile. CME - cystoid macular edema. PED - pigment epithelial detachment. IRF - intraretinal fluid. SRF - subretinal fluid. EZ - ellipsoid zone. ERM - epiretinal membrane. ORA - outer retinal atrophy. ORT - outer retinal tubulation. SRHM - subretinal hyper-reflective material. IRHM - intraretinal hyper-reflective material        Fluorescein Angiography Optos (Transit OS)       Right Eye   Progression has no prior data.   Left Eye   Progression has no prior data. Early phase findings include microaneurysm, vascular perfusion defect. Mid/Late phase findings include leakage, microaneurysm, vascular perfusion defect (No NV).   Notes **Images stored on drive**  Impression: OD: no images obtained due to mature cataract OS: moderate NPDR, late leaking MA, mild vascular perfusion defects, no NV         B-Scan Ultrasound - OD - Right Eye       Quality was good. Findings included vitreous opacities.   Notes **Images stored on drive**  Impression: OD: large cataract; tr vitreous opacities; no obvious RT/RD or mass                 ASSESSMENT/PLAN:    ICD-10-CM   1. Mature cataract  H26.8 B-Scan Ultrasound - OD - Right Eye  2. Decreased vision of right eye  H54.61   3. 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 Retinal edema  H35.81 OCT, Retina - OU - Both Eyes  5. Essential hypertension  I10   6. Hypertensive retinopathy of both eyes  H35.033 Fluorescein Angiography Optos (Transit OS)  7. Combined forms of age-related cataract of left eye  H25.812     1,2. Mature Cataract OD  - pt reports history of decreased vision OD -- first noticed 2 mos ago  - pt denies trauma, surgery OD; no pain or redness associated  - exam  shows white mature cataract OD -- no view of posterior pole  - b-scan 4.11.22 shows very large dense cataract OD, no RT/RD or mass; mild vit opaciticies  - The symptoms of cataract, surgical options, and treatments and risks were discussed with patient.  - discussed diagnosis and progression  - will refer to [facility] for cat eval  3,4. Moderate nonproliferative diabetic retinopathy w/o DME, OU - The incidence, risk factors for progression, natural history and treatment options for diabetic retinopathy were discussed with patient.   - The need for close monitoring of blood glucose, blood pressure, and serum lipids, avoiding cigarette or any type of tobacco, and the need for long term follow up was also discussed with patient. - exam shows some scattered MA - OCT without diabetic macular edema, OU - FA 4.11.22 shows late leaking MA OS, no NV OS - f/u in 3 months -- DFE/OCT  5,6. Hypertensive retinopathy OU - discussed importance of tight BP control - monitor  7. Mixed Cataract OS - The symptoms of cataract, surgical options, and treatments and risks were discussed with patient. - discussed diagnosis and progression - not yet visually significant - monitor   Ophthalmic Meds Ordered this visit:  No orders of the defined types were placed in this encounter.      Return in about 3 months (around 03/17/2021) for f/u NPDR OU, DFE,  OCT.  There are no Patient Instructions on file for this visit.  This document serves as a record of services personally performed by Karie Chimera, MD, PhD. It was created on their behalf by Herby Abraham, COA, an ophthalmic technician. The creation of this record is the provider's dictation and/or activities during the visit.    Electronically signed by: Herby Abraham, COA @TODAY @ 12:28 PM   This document serves as a record of services personally performed by , MD, PhD. It was created on their behalf by Karie Chimera. Glee Arvin, OA an  ophthalmic technician. The creation of this record is the provider's dictation and/or activities during the visit.    Electronically signed by: Manson Passey. Glee Arvin 04.11.2022 12:28 PM  06.11.2022, M.D., Ph.D. Diseases & Surgery of the Retina and Vitreous [facility] 12/16/2020   I have reviewed the above documentation for accuracy and completeness, and I agree with the above. 02/15/2021, M.D., Ph.D. 12/16/20 12:28 PM  Abbreviations: M myopia (nearsighted); A astigmatism; H hyperopia (farsighted); P presbyopia; Mrx spectacle prescription;  CTL contact lenses; OD right eye; OS left eye; OU both eyes  XT exotropia; ET esotropia; PEK punctate epithelial keratitis; PEE punctate epithelial erosions; DES dry eye syndrome; MGD meibomian gland dysfunction; ATs artificial tears; PFAT's preservative free artificial tears; NSC nuclear sclerotic cataract; PSC posterior subcapsular cataract; ERM epi-retinal membrane; PVD posterior vitreous detachment; RD retinal detachment; DM diabetes mellitus; DR diabetic retinopathy; NPDR non-proliferative diabetic retinopathy; PDR proliferative diabetic retinopathy; CSME clinically significant macular edema; DME diabetic macular edema; dbh dot blot hemorrhages; CWS cotton wool spot; POAG primary open angle glaucoma; C/D cup-to-disc ratio; HVF humphrey visual field; GVF goldmann visual field; OCT optical coherence tomography; IOP intraocular pressure; BRVO Branch retinal vein occlusion; CRVO central retinal vein occlusion; CRAO central retinal artery occlusion; BRAO branch retinal artery occlusion; RT retinal tear; SB scleral buckle; PPV pars plana vitrectomy; VH Vitreous hemorrhage; PRP panretinal laser photocoagulation; IVK intravitreal kenalog; VMT vitreomacular traction; MH Macular hole;  NVD neovascularization of the disc; NVE neovascularization elsewhere; AREDS age related eye disease study; ARMD age related macular degeneration; POAG primary  open angle glaucoma; EBMD epithelial/anterior basement membrane dystrophy; ACIOL anterior chamber intraocular lens; IOL intraocular lens; PCIOL posterior chamber intraocular lens; Phaco/IOL phacoemulsification with intraocular lens placement; PRK photorefractive keratectomy; LASIK laser assisted in situ keratomileusis; HTN hypertension; DM diabetes mellitus; COPD chronic obstructive pulmonary disease

## 2020-12-16 ENCOUNTER — Ambulatory Visit (INDEPENDENT_AMBULATORY_CARE_PROVIDER_SITE_OTHER): Payer: BC Managed Care – PPO | Admitting: Ophthalmology

## 2020-12-16 ENCOUNTER — Other Ambulatory Visit: Payer: Self-pay

## 2020-12-16 ENCOUNTER — Encounter (INDEPENDENT_AMBULATORY_CARE_PROVIDER_SITE_OTHER): Payer: Self-pay | Admitting: Ophthalmology

## 2020-12-16 DIAGNOSIS — H3581 Retinal edema: Secondary | ICD-10-CM | POA: Diagnosis not present

## 2020-12-16 DIAGNOSIS — H268 Other specified cataract: Secondary | ICD-10-CM

## 2020-12-16 DIAGNOSIS — H25812 Combined forms of age-related cataract, left eye: Secondary | ICD-10-CM

## 2020-12-16 DIAGNOSIS — E113393 Type 2 diabetes mellitus with moderate nonproliferative diabetic retinopathy without macular edema, bilateral: Secondary | ICD-10-CM | POA: Diagnosis not present

## 2020-12-16 DIAGNOSIS — H35033 Hypertensive retinopathy, bilateral: Secondary | ICD-10-CM | POA: Diagnosis not present

## 2020-12-16 DIAGNOSIS — I10 Essential (primary) hypertension: Secondary | ICD-10-CM

## 2020-12-16 DIAGNOSIS — H5461 Unqualified visual loss, right eye, normal vision left eye: Secondary | ICD-10-CM

## 2021-03-17 ENCOUNTER — Encounter (INDEPENDENT_AMBULATORY_CARE_PROVIDER_SITE_OTHER): Payer: BC Managed Care – PPO | Admitting: Ophthalmology

## 2022-03-02 DIAGNOSIS — E119 Type 2 diabetes mellitus without complications: Secondary | ICD-10-CM | POA: Diagnosis not present

## 2022-03-02 DIAGNOSIS — E782 Mixed hyperlipidemia: Secondary | ICD-10-CM | POA: Diagnosis not present

## 2022-03-11 DIAGNOSIS — E782 Mixed hyperlipidemia: Secondary | ICD-10-CM | POA: Diagnosis not present

## 2022-03-11 DIAGNOSIS — K7689 Other specified diseases of liver: Secondary | ICD-10-CM | POA: Diagnosis not present

## 2022-03-11 DIAGNOSIS — I10 Essential (primary) hypertension: Secondary | ICD-10-CM | POA: Diagnosis not present

## 2022-03-11 DIAGNOSIS — F1010 Alcohol abuse, uncomplicated: Secondary | ICD-10-CM | POA: Diagnosis not present

## 2022-06-19 DIAGNOSIS — I10 Essential (primary) hypertension: Secondary | ICD-10-CM | POA: Diagnosis not present

## 2022-06-19 DIAGNOSIS — E119 Type 2 diabetes mellitus without complications: Secondary | ICD-10-CM | POA: Diagnosis not present

## 2022-06-23 DIAGNOSIS — F1010 Alcohol abuse, uncomplicated: Secondary | ICD-10-CM | POA: Diagnosis not present

## 2022-06-23 DIAGNOSIS — I10 Essential (primary) hypertension: Secondary | ICD-10-CM | POA: Diagnosis not present

## 2022-06-23 DIAGNOSIS — K7689 Other specified diseases of liver: Secondary | ICD-10-CM | POA: Diagnosis not present

## 2022-06-23 DIAGNOSIS — E782 Mixed hyperlipidemia: Secondary | ICD-10-CM | POA: Diagnosis not present

## 2022-07-03 ENCOUNTER — Encounter (HOSPITAL_COMMUNITY): Payer: Self-pay | Admitting: *Deleted

## 2022-07-03 ENCOUNTER — Emergency Department (HOSPITAL_COMMUNITY): Payer: BC Managed Care – PPO

## 2022-07-03 ENCOUNTER — Other Ambulatory Visit: Payer: Self-pay

## 2022-07-03 ENCOUNTER — Emergency Department (HOSPITAL_COMMUNITY)
Admission: EM | Admit: 2022-07-03 | Discharge: 2022-07-03 | Disposition: A | Discharge status: Home / Self Care | Payer: BC Managed Care – PPO | Attending: Emergency Medicine | Admitting: Emergency Medicine

## 2022-07-03 DIAGNOSIS — Z79899 Other long term (current) drug therapy: Secondary | ICD-10-CM | POA: Insufficient documentation

## 2022-07-03 DIAGNOSIS — H538 Other visual disturbances: Secondary | ICD-10-CM | POA: Diagnosis not present

## 2022-07-03 DIAGNOSIS — Z7984 Long term (current) use of oral hypoglycemic drugs: Secondary | ICD-10-CM | POA: Diagnosis not present

## 2022-07-03 DIAGNOSIS — E113593 Type 2 diabetes mellitus with proliferative diabetic retinopathy without macular edema, bilateral: Secondary | ICD-10-CM | POA: Diagnosis not present

## 2022-07-03 DIAGNOSIS — I10 Essential (primary) hypertension: Secondary | ICD-10-CM | POA: Diagnosis not present

## 2022-07-03 HISTORY — DX: Pure hypercholesterolemia, unspecified: E78.00

## 2022-07-03 HISTORY — DX: Unspecified cataract: H26.9

## 2022-07-03 HISTORY — DX: Polyneuropathy, unspecified: G62.9

## 2022-07-03 HISTORY — DX: Essential (primary) hypertension: I10

## 2022-07-03 LAB — COMPREHENSIVE METABOLIC PANEL
ALT: 29 U/L (ref 0–44)
AST: 17 U/L (ref 15–41)
Albumin: 3.5 g/dL (ref 3.5–5.0)
Alkaline Phosphatase: 61 U/L (ref 38–126)
Anion gap: 6 (ref 5–15)
BUN: 19 mg/dL (ref 6–20)
CO2: 23 mmol/L (ref 22–32)
Calcium: 9 mg/dL (ref 8.9–10.3)
Chloride: 111 mmol/L (ref 98–111)
Creatinine, Ser: 1.55 mg/dL — ABNORMAL HIGH (ref 0.61–1.24)
GFR, Estimated: 52 mL/min — ABNORMAL LOW (ref >60)
Glucose, Bld: 147 mg/dL — ABNORMAL HIGH (ref 70–99)
Potassium: 4.7 mmol/L (ref 3.5–5.1)
Sodium: 140 mmol/L (ref 135–145)
Total Bilirubin: 0.8 mg/dL (ref 0.3–1.2)
Total Protein: 6.6 g/dL (ref 6.5–8.1)

## 2022-07-03 LAB — CBC
HCT: 41 % (ref 39.0–52.0)
Hemoglobin: 13.6 g/dL (ref 13.0–17.0)
MCH: 30 pg (ref 26.0–34.0)
MCHC: 33.2 g/dL (ref 30.0–36.0)
MCV: 90.5 fL (ref 80.0–100.0)
Platelets: 219 10*3/uL (ref 150–400)
RBC: 4.53 MIL/uL (ref 4.22–5.81)
RDW: 13.1 % (ref 11.5–15.5)
WBC: 7.9 10*3/uL (ref 4.0–10.5)
nRBC: 0 % (ref 0.0–0.2)

## 2022-07-03 MED ORDER — IOHEXOL 350 MG/ML SOLN
75.0000 mL | Freq: Once | INTRAVENOUS | Status: AC | PRN
  Administered 2022-07-03: 75 mL via INTRAVENOUS

## 2022-07-03 MED ORDER — AMLODIPINE BESYLATE 5 MG PO TABS: 10.0000 mg | ORAL_TABLET | Freq: Once | ORAL | Status: DC

## 2022-07-03 MED ORDER — AMLODIPINE BESYLATE 5 MG PO TABS: 5.0000 mg | ORAL_TABLET | Freq: Once | ORAL | Status: DC

## 2022-07-03 MED ORDER — AMLODIPINE BESYLATE 5 MG PO TABS
10.0000 mg | ORAL_TABLET | Freq: Once | ORAL | Status: AC
  Administered 2022-07-03: 10 mg via ORAL
  Filled 2022-07-03: qty 2

## 2022-07-03 NOTE — ED Provider Notes (Signed)
[facility] EMERGENCY DEPARTMENT Provider Note   CSN: [id number] Arrival date & time: 07/03/22  1317     History  Chief Complaint  Patient presents with   Blurred Vision    Dylan Roberts is a 56 y.o. male with history of cataracts, diabetes, hyperlipidemia, hypertension, neuropathy who presents the emergency department complaining of blurry vision.  Patient states that he woke up this morning and his vision was at its baseline.  After he drove to work he started noticing blurry vision in both eyes.  States that his right eye is normally worse than his left, because this is the one that has cataracts.  When EMS first arrived his blood pressure was 218/112.  He states that he has only missed a few doses of his blood pressure medicine ever since being placed on it.  But did take it last night.  Has previously seen ophthalmology for hypertensive and diabetic retinopathy.  No eye pain, headache, dizziness.  HPI     Home Medications Prior to Admission medications   Medication Sig Start Date End Date Taking? Authorizing Provider  gabapentin (NEURONTIN) 300 MG capsule Take 1 capsule (300 mg total) by mouth 3 (three) times daily. 1 pill day 1, 2 pills day 2 then three times a day. Patient not taking: Reported on 08/28/2016 06/11/16   Davonna Belling, MD  glipiZIDE (GLUCOTROL) 10 MG tablet Take 10 mg by mouth 2 (two) times daily before a meal.  08/03/16   [provider]  HYDROcodone-acetaminophen (NORCO) 5-325 MG tablet Take 1 tablet by mouth every 4 (four) hours as needed. 08/28/16   Daleen Bo, MD  ibuprofen (ADVIL,MOTRIN) 200 MG tablet Take 800 mg by mouth daily as needed for mild pain.    [provider]  lisinopril (PRINIVIL,ZESTRIL) 5 MG tablet Take 5 mg by mouth daily.  08/03/16   [provider]  metFORMIN (GLUCOPHAGE) 500 MG tablet Take 1 tablet (500 mg total) by mouth 2 (two) times daily with a meal. 06/11/16   Davonna Belling, MD  pregabalin  (LYRICA) 75 MG capsule Take 75 mg by mouth 2 (two) times daily.    [provider]  SITagliptin Phosphate (JANUVIA PO) Take 1 tablet by mouth daily.    [provider]      Allergies    Patient has no known allergies.    Review of Systems   Review of Systems  Eyes:  Positive for visual disturbance. Negative for photophobia, pain, discharge, redness and itching.  Musculoskeletal:  Negative for neck pain.  Neurological:  Negative for dizziness, facial asymmetry, speech difficulty, weakness, light-headedness, numbness and headaches.  All other systems reviewed and are negative.   Physical Exam Updated Vital Signs BP (!) 185/101   Pulse 75   Temp 98.1 F (36.7 C) (Oral)   Resp 16   Ht 5\' 8"  (1.727 m)   Wt 74.4 kg   SpO2 100%   BMI 24.94 kg/m  Physical Exam Vitals and nursing note reviewed.  Constitutional:      Appearance: Normal appearance.  HENT:     Head: Normocephalic and atraumatic.  Eyes:     Extraocular Movements: Extraocular movements intact.     Conjunctiva/sclera: Conjunctivae normal.     Comments: Bilateral miosis Attempted visual acuity test. Pt unable to make out any letter within arm-length distance.   Pulmonary:     Effort: Pulmonary effort is normal. No respiratory distress.  Skin:    General: Skin is warm and dry.  Neurological:  Mental Status: He is alert.     Comments: Neuro: Speech is clear, able to follow commands. CN III-XII intact grossly intact. Sensation intact throughout. Str 5/5 all extremities.  Psychiatric:        Mood and Affect: Mood normal.        Behavior: Behavior normal.     ED Results / Procedures / Treatments   Labs (all labs ordered are listed, but only abnormal results are displayed) Labs Reviewed  COMPREHENSIVE METABOLIC PANEL - Abnormal; Notable for the following components:      Result Value   Glucose, Bld 147 (*)    Creatinine, Ser 1.55 (*)    GFR, Estimated 52 (*)    All other components within  normal limits  CBC    EKG None  Radiology CT Angio Head W or Wo Contrast  Result Date: 07/03/2022 CLINICAL DATA:  Blurry vision starting this morning at 5:30 a.m. Hypertension. EXAM: CT ANGIOGRAPHY HEAD TECHNIQUE: Multidetector CT imaging of the head was performed using the standard protocol during bolus administration of intravenous contrast. Multiplanar CT image reconstructions and MIPs were obtained to evaluate the vascular anatomy. RADIATION DOSE REDUCTION: This exam was performed according to the departmental dose-optimization program which includes automated exposure control, adjustment of the mA and/or kV according to patient size and/or use of iterative reconstruction technique. CONTRAST:  31mL OMNIPAQUE IOHEXOL 350 MG/ML SOLN COMPARISON:  CT head 11/28/2013 FINDINGS: CT HEAD Brain: There is no acute intracranial hemorrhage, extra-axial fluid collection, or acute infarct. Parenchymal volume is normal. The ventricles are normal in size. Gray-white differentiation is preserved. There is no mass lesion.  There is no mass effect or midline shift. Vascular: See below. Skull: Normal. Negative for fracture or focal lesion. Sinuses and orbits: The imaged paranasal sinuses are clear. The imaged globes and orbits are unremarkable. Other: None. CTA HEAD Anterior circulation: There is mixed plaque in the proximal cavernous segment of the right intracranial ICA resulting in moderate stenosis. There is no significant stenosis on the left. The bilateral MCAs are patent, without proximal stenosis or occlusion or significant vessel irregularity. 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 The ACAs are otherwise patent, without proximal stenosis or occlusion, or significant vessel irregularity. There is no aneurysm or AVM. Posterior circulation: The bilateral V4 segments are patent. The basilar artery is patent. The major cerebellar arteries are patent. The bilateral PCAs are patent,  without proximal stenosis or occlusion. There is no significant vessel irregularity. There is no aneurysm or AVM. Venous sinuses: Patent. Anatomic variants: None. Review of the MIP images confirms the above findings. IMPRESSION: 1. Normal noncontrast head CT with no acute intracranial pathology. 2. 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 Otherwise normal intracranial vasculature. Electronically Signed   By: Lesia Hausen M.D.   On: 07/03/2022 16:05    Procedures Procedures    Medications Ordered in ED Medications  iohexol (OMNIPAQUE) 350 MG/ML injection 75 mL (75 mLs Intravenous Contrast Given 07/03/22 1541)  amLODipine (NORVASC) tablet 10 mg (10 mg Oral Given 07/03/22 1617)    ED Course/ Medical Decision Making/ A&P                           Medical Decision Making Amount and/or Complexity of Data Reviewed Labs: ordered. Radiology: ordered.   This patient is a 56 y.o. male  who presents to the ED for concern of blurry vision.   Differential diagnoses prior to evaluation: The  emergent differential diagnosis includes, but is not limited to, CVA, uveitis, lens changes, cataracts, eye trauma, migraine.  This is not an exhaustive differential.   Past Medical History / Co-morbidities: Cataracts, HTN, diabetes, HLD, and neuropathy  Additional history: Chart reviewed. Pertinent results include: Last saw ophthalmologist in April 2022. Reviewed medications with patient -- he is currently on 5mg  amlodipine and 20mg  lisinopril daily for HTN  Physical Exam: Physical exam performed. The pertinent findings include: Bilateral miosis of pupils, but equal. EOMI.   Lab Tests/Imaging studies: I personally interpreted labs/imaging and the pertinent results include:  Creatinine 1.55, elevated compared to labs performed 5 years ago. Otherwise CMP unremarkable. CBC unremarkable. CTA head with moderate stenosis of proximal right cavernous ICA but without acute abnormalities. I agree with  the radiologist interpretation.  Medications: I ordered medication including amlodipine for elevated BP.  I have reviewed the patients home medicines and have made adjustments as needed.   Disposition: After consideration of the diagnostic results and the patients response to treatment, I feel that emergency department workup does not suggest an emergent condition requiring admission or immediate intervention beyond what has been performed at this time. The plan is: discharge to home with increase of BP medication (will recommend taking 10mg  amlodipine daily with PCP follow up) and strong recommendation to follow up with ophthalmology as soon as possible. The patient is safe for discharge and has been instructed to return immediately for worsening symptoms, change in symptoms or any other concerns.  I discussed this case with my attending physician Dr. who cosigned this note including patient's presenting symptoms, physical exam, and planned diagnostics and interventions. Attending physician stated agreement with plan or made changes to plan which were implemented.   Final Clinical Impression(s) / ED Diagnoses Final diagnoses:  Blurred vision, bilateral    Rx / DC Orders ED Discharge Orders     None      Portions of this report may have been transcribed using voice recognition software. Every effort was made to ensure accuracy; however, inadvertent computerized transcription errors may be present.    07/03/22 1641    Estell Harpin, MD 07/07/22 [phone]

## 2022-07-03 NOTE — Discharge Instructions (Addendum)
You were seen in the emergency department for blurred vision.  As we discussed, the imaging performed of your head did not show any evidence of stroke.   You are currently on 20 mg lisinopril and 5 mg amlodipine for your blood pressure. I want you to start taking two tablets (10 mg) of amlodipine daily, and make sure your primary doctor is aware of this. Since I gave you these medicines here already, you can skip taking this tonight and start tomorrow.   It is incredibly important you follow up with the eye doctor as soon as possible for evaluation.  Continue to monitor how you're doing and return to the ER for new or worsening symptoms.

## 2022-07-03 NOTE — ED Triage Notes (Signed)
Pt brought in by [facility] from work after c/o bilateral blurry vision that started this morning around 0530. Pt's BP 218/112, HR 88, O2 sat 98%, CBG 167 for EMS. Pt reports compliance with BP medication, but not diabetic medication. Denies headache, CP, SOB, weakness.

## 2022-07-03 NOTE — ED Notes (Signed)
Attempted visual acuity test. Pt unable to make out any letter within arm-length distance.

## 2022-07-06 DIAGNOSIS — E118 Type 2 diabetes mellitus with unspecified complications: Secondary | ICD-10-CM | POA: Diagnosis not present

## 2022-07-06 DIAGNOSIS — R944 Abnormal results of kidney function studies: Secondary | ICD-10-CM | POA: Diagnosis not present

## 2022-07-06 DIAGNOSIS — I10 Essential (primary) hypertension: Secondary | ICD-10-CM | POA: Diagnosis not present

## 2022-07-06 DIAGNOSIS — H538 Other visual disturbances: Secondary | ICD-10-CM | POA: Diagnosis not present

## 2023-10-21 DIAGNOSIS — H2523 Age-related cataract, morgagnian type, bilateral: Secondary | ICD-10-CM | POA: Diagnosis not present

## 2023-11-08 ENCOUNTER — Encounter (HOSPITAL_COMMUNITY)
Admission: RE | Admit: 2023-11-08 | Discharge: 2023-11-08 | Disposition: A | Discharge status: Home / Self Care | Payer: BLUE CROSS/BLUE SHIELD | Source: Ambulatory Visit | Attending: Ophthalmology | Admitting: Ophthalmology

## 2023-11-08 DIAGNOSIS — H2521 Age-related cataract, morgagnian type, right eye: Secondary | ICD-10-CM | POA: Diagnosis not present

## 2023-11-10 NOTE — Pre-Procedure Instructions (Signed)
 Preop phone call were attempted by another staff member on 2/28 @ 1307 and 3/3 @ 1020. I attempted calling both [phone] and [phone] today. Both numbers rang with no pick up and no VM.

## 2023-11-11 ENCOUNTER — Other Ambulatory Visit: Payer: Self-pay

## 2023-11-11 ENCOUNTER — Encounter (HOSPITAL_COMMUNITY): Payer: Self-pay

## 2023-11-11 NOTE — Pre-Procedure Instructions (Signed)
 Attempted pre-op phone call to [facility] the office has her as an additional contact person. Left VM for her to call us back.

## 2023-11-11 NOTE — H&P (Signed)
 Surgical History & Physical  Patient Name: Dylan Roberts  DOB: 07-25-1966  Surgery: Cataract extraction with intraocular lens implant phacoemulsification; Right Eye Surgeon: [name] MD Surgery Date: 11/15/2023 Pre-Op Date: 10/21/2023  HPI: A 30 Yr. old male patient 1.  The patient is a new patient present for Cataract Evaluation. The patient complains of cloudy vision, which began many years ago. Both eyes are affected. The episode is constant. The patient describes foggy symptoms affecting their eyes/vision. The condition's severity is worsening. The complaint is associated with flashes and itching os. Patient cannot see anything he states it's a real big fog over his vision. This is negatively affecting the patient's quality of life and the patient is unable to function adequately in life with the current level of vision. HPI was performed by [name] .  Medical History: Cataracts  Diabetes High Blood Pressure  Review of Systems Cardiovascular High Blood Pressure, high cholesterol, diabetes All recorded systems are negative except as noted above.  Social Current every day smoker   Medication None : Patient just started with his insurance.  He is currently not on any medications.  Patient was to see PCP after this appt.   Sx/Procedures None  Drug Allergies  NKDA  History & Physical: Heent: cataracts NECK: supple without bruits LUNGS: lungs clear to auscultation CV: regular rate and rhythm Abdomen: soft and non-tender  Impression & Plan: Assessment: 1.  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  Cataract accounts for the patient's decreased vision. This visual impairment is not correctable with a tolerable change in glasses or contact lenses. Cataract surgery with an implantation of a new lens should significantly improve the visual and functional status of the patient. Discussed all risks, benefits, alternatives, and potential  complications. Discussed the procedures and recovery. Patient desires to have surgery. A-scan ordered and performed today for intra-ocular lens calculations. The surgery will be performed in order to improve vision for driving, reading, and for eye examinations. Recommend phacoemulsification with intra-ocular lens. Recommend Dextenza for post-operative pain and inflammation. Right Eye worse - first. Dilates poorly - shugacaine by protocol. Vision [employer]. Malyugin Ring. Omidira.

## 2023-11-15 ENCOUNTER — Encounter (HOSPITAL_COMMUNITY): Payer: Self-pay | Admitting: Ophthalmology

## 2023-11-15 ENCOUNTER — Encounter (HOSPITAL_COMMUNITY)
Admission: RE | Disposition: A | Discharge status: Home / Self Care | Payer: Self-pay | Source: Home / Self Care | Attending: Ophthalmology

## 2023-11-15 ENCOUNTER — Ambulatory Visit (HOSPITAL_COMMUNITY)
Admission: RE | Admit: 2023-11-15 | Discharge: 2023-11-15 | Disposition: A | Discharge status: Home / Self Care | Payer: BLUE CROSS/BLUE SHIELD | Attending: Ophthalmology | Admitting: Ophthalmology

## 2023-11-15 ENCOUNTER — Ambulatory Visit (HOSPITAL_BASED_OUTPATIENT_CLINIC_OR_DEPARTMENT_OTHER): Payer: Self-pay | Admitting: Anesthesiology

## 2023-11-15 ENCOUNTER — Ambulatory Visit (HOSPITAL_COMMUNITY): Payer: Self-pay | Admitting: Anesthesiology

## 2023-11-15 ENCOUNTER — Other Ambulatory Visit: Payer: Self-pay

## 2023-11-15 DIAGNOSIS — H2523 Age-related cataract, morgagnian type, bilateral: Secondary | ICD-10-CM | POA: Insufficient documentation

## 2023-11-15 DIAGNOSIS — H2511 Age-related nuclear cataract, right eye: Secondary | ICD-10-CM

## 2023-11-15 DIAGNOSIS — F17200 Nicotine dependence, unspecified, uncomplicated: Secondary | ICD-10-CM | POA: Insufficient documentation

## 2023-11-15 DIAGNOSIS — H2181 Floppy iris syndrome: Secondary | ICD-10-CM | POA: Diagnosis not present

## 2023-11-15 DIAGNOSIS — H2521 Age-related cataract, morgagnian type, right eye: Secondary | ICD-10-CM | POA: Diagnosis not present

## 2023-11-15 DIAGNOSIS — E1136 Type 2 diabetes mellitus with diabetic cataract: Secondary | ICD-10-CM | POA: Insufficient documentation

## 2023-11-15 DIAGNOSIS — I10 Essential (primary) hypertension: Secondary | ICD-10-CM | POA: Insufficient documentation

## 2023-11-15 DIAGNOSIS — Z7984 Long term (current) use of oral hypoglycemic drugs: Secondary | ICD-10-CM | POA: Diagnosis not present

## 2023-11-15 HISTORY — PX: CATARACT EXTRACTION W/PHACO: SHX586

## 2023-11-15 LAB — GLUCOSE, CAPILLARY: Glucose-Capillary: 183 mg/dL — ABNORMAL HIGH (ref 70–99)

## 2023-11-15 SURGERY — PHACOEMULSIFICATION, CATARACT, WITH IOL INSERTION
Anesthesia: Monitor Anesthesia Care | Site: Eye | Laterality: Right

## 2023-11-15 MED ORDER — NA HYALUR & NA CHOND-NA HYALUR 0.55-0.5 ML IO KIT
PACK | INTRAOCULAR | Status: DC | PRN
  Administered 2023-11-15: 1 via INTRAOCULAR

## 2023-11-15 MED ORDER — TETRACAINE HCL 0.5 % OP SOLN
1.0000 [drp] | OPHTHALMIC | Status: AC | PRN
  Administered 2023-11-15 (×3): 1 [drp] via OPHTHALMIC

## 2023-11-15 MED ORDER — TRYPAN BLUE 0.06 % IO SOSY
PREFILLED_SYRINGE | INTRAOCULAR | Status: DC | PRN
  Administered 2023-11-15 (×2): .5 mL via INTRAOCULAR

## 2023-11-15 MED ORDER — SODIUM CHLORIDE 0.9% FLUSH: 3.0000 mL | INTRAVENOUS | Status: DC | PRN

## 2023-11-15 MED ORDER — SODIUM HYALURONATE 23MG/ML IO SOSY
PREFILLED_SYRINGE | INTRAOCULAR | Status: DC | PRN
  Administered 2023-11-15: .6 mL via INTRAOCULAR

## 2023-11-15 MED ORDER — MIDAZOLAM HCL 2 MG/2ML IJ SOLN
INTRAMUSCULAR | Status: DC | PRN
  Administered 2023-11-15: 1 mg via INTRAVENOUS

## 2023-11-15 MED ORDER — MOXIFLOXACIN HCL 5 MG/ML IO SOLN
INTRAOCULAR | Status: DC | PRN
  Administered 2023-11-15: .3 mL via INTRACAMERAL

## 2023-11-15 MED ORDER — STERILE WATER FOR IRRIGATION IR SOLN
Status: DC | PRN
  Administered 2023-11-15: 25 mL

## 2023-11-15 MED ORDER — POVIDONE-IODINE 5 % OP SOLN
OPHTHALMIC | Status: DC | PRN
  Administered 2023-11-15: 1 via OPHTHALMIC

## 2023-11-15 MED ORDER — MIDAZOLAM HCL 2 MG/2ML IJ SOLN
INTRAMUSCULAR | Status: AC
  Filled 2023-11-15: qty 2

## 2023-11-15 MED ORDER — LIDOCAINE HCL (PF) 1 % IJ SOLN
INTRAOCULAR | Status: DC | PRN
  Administered 2023-11-15: 1 mL via OPHTHALMIC

## 2023-11-15 MED ORDER — PHENYLEPHRINE HCL 2.5 % OP SOLN
1.0000 [drp] | OPHTHALMIC | Status: AC | PRN
  Administered 2023-11-15 (×3): 1 [drp] via OPHTHALMIC

## 2023-11-15 MED ORDER — SODIUM CHLORIDE 0.9% FLUSH
3.0000 mL | Freq: Two times a day (BID) | INTRAVENOUS | Status: DC
  Administered 2023-11-15: 8 mL via INTRAVENOUS

## 2023-11-15 MED ORDER — TROPICAMIDE 1 % OP SOLN
1.0000 [drp] | OPHTHALMIC | Status: AC | PRN
  Administered 2023-11-15 (×3): 1 [drp] via OPHTHALMIC

## 2023-11-15 MED ORDER — LIDOCAINE HCL 3.5 % OP GEL
1.0000 | Freq: Once | OPHTHALMIC | Status: AC
  Administered 2023-11-15: 1 via OPHTHALMIC

## 2023-11-15 MED ORDER — PHENYLEPHRINE-KETOROLAC 1-0.3 % IO SOLN
INTRAOCULAR | Status: DC | PRN
  Administered 2023-11-15: 500 mL via OPHTHALMIC

## 2023-11-15 MED ORDER — TRYPAN BLUE 0.06 % IO SOSY
PREFILLED_SYRINGE | INTRAOCULAR | Status: AC
  Filled 2023-11-15: qty 0.5

## 2023-11-15 MED ORDER — LIDOCAINE HCL (PF) 1 % IJ SOLN
INTRAMUSCULAR | Status: AC
  Filled 2023-11-15: qty 2

## 2023-11-15 MED ORDER — BSS IO SOLN
INTRAOCULAR | Status: DC | PRN
  Administered 2023-11-15: 15 mL via INTRAOCULAR

## 2023-11-15 SURGICAL SUPPLY — 15 items
CATARACT SUITE SIGHTPATH (MISCELLANEOUS) ×1 IMPLANT
CLOTH BEACON ORANGE TIMEOUT ST (SAFETY) ×1 IMPLANT
EYE SHIELD UNIVERSAL CLEAR (GAUZE/BANDAGES/DRESSINGS) IMPLANT
FEE CATARACT SUITE SIGHTPATH (MISCELLANEOUS) ×1 IMPLANT
GLOVE BIOGEL PI IND STRL 6.5 (GLOVE) IMPLANT
GLOVE BIOGEL PI IND STRL 7.0 (GLOVE) ×2 IMPLANT
LENS IOL TECNIS EYHANCE 18.0 (Intraocular Lens) IMPLANT
NDL HYPO 18GX1.5 BLUNT FILL (NEEDLE) ×1 IMPLANT
NEEDLE HYPO 18GX1.5 BLUNT FILL (NEEDLE) ×1 IMPLANT
PAD ARMBOARD 7.5X6 YLW CONV (MISCELLANEOUS) ×1 IMPLANT
RING MALYGIN 7.0 (MISCELLANEOUS) IMPLANT
SIGHTPATH CAT PROC W REG LENS (Ophthalmic Related) IMPLANT
SYR TB 1ML LL NO SAFETY (SYRINGE) ×1 IMPLANT
TAPE SURG TRANSPORE 1 IN (GAUZE/BANDAGES/DRESSINGS) IMPLANT
WATER STERILE IRR 250ML POUR (IV SOLUTION) ×1 IMPLANT

## 2023-11-15 NOTE — Interval H&P Note (Signed)
 History and Physical Interval Note:  11/15/2023 7:46 AM  Dylan Roberts  has presented today for surgery, with the diagnosis of Morgagnian hypermature age related cataract, right eye.  The various methods of treatment have been discussed with the patient and family. After consideration of risks, benefits and other options for treatment, the patient has consented to  Procedure(s) with comments: PHACOEMULSIFICATION, CATARACT, WITH IOL INSERTION (Right) - CDE: as a surgical intervention.  The patient's history has been reviewed, patient examined, no change in status, stable for surgery.  I have reviewed the patient's chart and labs.  Questions were answered to the patient's satisfaction.     [name]

## 2023-11-15 NOTE — Discharge Instructions (Addendum)
 Please discharge patient when stable, will follow up today with Dr. June Leap at the [facility] office immediately following discharge.  Leave shield in place until visit.  All paperwork with discharge instructions will be given at the office.  [facility] Address:  [address]  [place], [place] [address]

## 2023-11-15 NOTE — Anesthesia Procedure Notes (Signed)
 Procedure Name: MAC Date/Time: 11/15/2023 8:01 AM  Performed by: Julian Reil, CRNAPre-anesthesia Checklist: Patient identified, Emergency Drugs available, Suction available and Patient being monitored Patient Re-evaluated:Patient Re-evaluated prior to induction Oxygen Delivery Method: Nasal cannula Placement Confirmation: positive ETCO2

## 2023-11-15 NOTE — Op Note (Signed)
 Date of procedure: 11/15/23  Pre-operative diagnosis: Mature Visually significant age-related cataract, Right Eye (H25.21); Poor dilation, right eye  Post-operative diagnosis:  Mature Visually significant age-related cataract, Right Eye Intra-operative floppy iris syndrome, Right eye  Procedure: Complex Removal of cataract via phacoemulsification and insertion of intra-ocular lens [employer] DIB00 +18.0D into the capsular bag of the Right Eye  Attending surgeon: Rudy Jew. Sotiria Keast, MD, MA  Anesthesia: MAC, Topical Akten  Complications: None  Estimated Blood Loss: <58mL (minimal)  Specimens: None  Implants: As above  Indications:  Mature Visually significant age-related cataract, Right Eye  Procedure:  The patient was seen and identified in the pre-operative area. The operative eye was identified and dilated.  The operative eye was marked.  Topical anesthesia was administered to the operative eye.     The patient was then to the operative suite and placed in the supine position.  A timeout was performed confirming the patient, procedure to be performed, and all other relevant information.   The patient's face was prepped and draped in the usual fashion for intra-ocular surgery.  A lid speculum was placed into the operative eye and the surgical microscope moved into place and focused.  A lack of red reflex due to a mature cataract was confirmed.  A superotemporal paracentesis was created using a 20 gauge paracentesis blade.  Vision blue was injected into the anterior chamber.  Shugarcaine was injected into the anterior chamber.  Viscoelastic was injected into the anterior chamber.  A temporal clear-corneal main wound incision was created using a 2.11mm microkeratome. A 7.33mm Malyugin ring was placed. A continuous curvilinear capsulorrhexis was initiated using an irrigating cystitome and completed using capsulorrhexis forceps.  Hydrodissection and hydrodeliniation were performed.   Viscoelastic was injected into the anterior chamber.  A phacoemulsification handpiece and a chopper as a second instrument were used to remove the nucleus and epinucleus. The irrigation/aspiration handpiece was used to remove any remaining cortical material.   The capsular bag was reinflated with viscoelastic, checked, and found to be intact. The intraocular lens was inserted into the capsular bag and dialed into place using a kuglen hook. The Malyugin ring was removed. The irrigation/aspiration handpiece was used to remove any remaining viscoelastic.  The clear corneal wound and paracentesis wounds were then hydrated and checked with Weck-Cels to be watertight. 0.77mL of moxifloxacin was injected into the anterior chamber. The lid-speculum and drape was removed, and the patient's face was cleaned with a wet and dry 4x4. A clear shield was taped over the eye. The patient was taken to the post-operative care unit in good condition, having tolerated the procedure well.  Post-Op Instructions: The patient will follow up at [facility] for a same day post-operative evaluation and will receive all other orders and instructions.

## 2023-11-15 NOTE — Transfer of Care (Signed)
 Immediate Anesthesia Transfer of Care Note  Patient: Dylan Roberts  Procedure(s) Performed: PHACOEMULSIFICATION, CATARACT, WITH IOL INSERTION (Right: Eye)  Patient Location: Short Stay  Anesthesia Type:MAC  Level of Consciousness: awake, alert , and oriented  Airway & Oxygen Therapy: Patient Spontanous Breathing  Post-op Assessment: Report given to RN and Post -op Vital signs reviewed and stable  Post vital signs: Reviewed and stable  Last Vitals:  Vitals Value Taken Time  BP    Temp    Pulse    Resp    SpO2      Last Pain:  Vitals:   11/15/23 0714  TempSrc: Oral  PainSc: 0-No pain      Patients Stated Pain Goal: 4 (11/15/23 0714)  Complications: No notable events documented.

## 2023-11-15 NOTE — Anesthesia Postprocedure Evaluation (Signed)
 Anesthesia Post Note  Patient: Dylan Roberts  Procedure(s) Performed: PHACOEMULSIFICATION, CATARACT, WITH IOL INSERTION (Right: Eye)  Patient location during evaluation: Phase II Anesthesia Type: General Level of consciousness: awake Pain management: pain level controlled Vital Signs Assessment: post-procedure vital signs reviewed and stable Respiratory status: spontaneous breathing and respiratory function stable Cardiovascular status: blood pressure returned to baseline and stable Postop Assessment: no headache and no apparent nausea or vomiting Anesthetic complications: no Comments: Late entry   No notable events documented.   Last Vitals:  Vitals:   11/15/23 0714 11/15/23 0830  BP: (!) 167/110 (!) 242/128  Pulse: 83 85  Resp: 19 16  Temp: 36.7 C 36.4 C  SpO2: 100% 100%    Last Pain:  Vitals:   11/15/23 0830  TempSrc: Oral  PainSc: 0-No pain                 Windell Norfolk

## 2023-11-15 NOTE — Anesthesia Preprocedure Evaluation (Signed)
 Anesthesia Evaluation  Patient identified by MRN, date of birth, ID band Patient awake    Reviewed: Allergy & Precautions, H&P , NPO status , Patient's Chart, lab work & pertinent test results, reviewed documented beta blocker date and time   Airway Mallampati: II  TM Distance: >3 FB Neck ROM: full    Dental no notable dental hx.    Pulmonary neg pulmonary ROS, Current Smoker and Patient abstained from smoking.   Pulmonary exam normal breath sounds clear to auscultation       Cardiovascular Exercise Tolerance: Good hypertension, negative cardio ROS  Rhythm:regular Rate:Normal     Neuro/Psych negative neurological ROS  negative psych ROS   GI/Hepatic negative GI ROS, Neg liver ROS,,,  Endo/Other  negative endocrine ROSdiabetes    Renal/GU negative Renal ROS  negative genitourinary   Musculoskeletal   Abdominal   Peds  Hematology negative hematology ROS (+)   Anesthesia Other Findings   Reproductive/Obstetrics negative OB ROS                             Anesthesia Physical Anesthesia Plan  ASA: 2  Anesthesia Plan:    Post-op Pain Management:    Induction:   PONV Risk Score and Plan:   Airway Management Planned:   Additional Equipment:   Intra-op Plan:   Post-operative Plan:   Informed Consent: I have reviewed the patients History and Physical, chart, labs and discussed the procedure including the risks, benefits and alternatives for the proposed anesthesia with the patient or authorized representative who has indicated his/her understanding and acceptance.     Dental Advisory Given  Plan Discussed with: CRNA  Anesthesia Plan Comments:        Anesthesia Quick Evaluation

## 2023-11-22 DIAGNOSIS — H2522 Age-related cataract, morgagnian type, left eye: Secondary | ICD-10-CM | POA: Diagnosis not present

## 2023-12-08 ENCOUNTER — Encounter (HOSPITAL_COMMUNITY)
Admission: RE | Admit: 2023-12-08 | Discharge: 2023-12-08 | Disposition: A | Discharge status: Home / Self Care | Payer: BLUE CROSS/BLUE SHIELD | Source: Ambulatory Visit | Attending: Ophthalmology | Admitting: Ophthalmology

## 2023-12-08 ENCOUNTER — Encounter (HOSPITAL_COMMUNITY): Payer: Self-pay

## 2023-12-08 ENCOUNTER — Other Ambulatory Visit: Payer: Self-pay

## 2023-12-08 NOTE — H&P (Signed)
 Surgical History & Physical  Patient Name: Dylan Roberts DOB: 11-12-1965  Surgery: Cataract extraction with intraocular lens implant phacoemulsification; Left Eye  Surgeon: [name] MD Surgery Date:  12-10-23 Pre-Op Date:  11-22-23  HPI: A 68 Yr. old male patient \\r \npresent for 1 week post op OD. Patient is doing well. Vision is slowly coming along. Denies pain or discomfort. 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 This has been present for several years. he is unable to see much of anything other than bright lights. This is negatively affecting the patient\'s quality of life and the patient is unable to function adequately in life with the current level of vision.\r\nHPI Completed by Dr. [name]  Medical History: Cataracts Diabetes High Blood Pressure  Review of Systems Cardiovascular High Blood Pressure, high cholesterol, diabetes All recorded systems are negative except as noted above.  Social   Current every day smoker   Medication Prednisolone-moxiflox-bromfen,   Sx/Procedures Phaco c IOL OD,   Drug Allergies   NKDA  History & Physical: Heent: cataract; left eye NECK: supple without bruits LUNGS: lungs clear to auscultation CV: regular rate and rhythm Abdomen: soft and non-tender Impression & Plan: Assessment: 1.  CATARACT EXTRACTION STATUS; Right Eye (Z98.41) 2.  CATARACT HYPERMATURE (MORGAGNIAN) AGE RELATED; Left Eye (H25.22)  Plan: 1.  2 weeks after cataract surgery. Doing well with improved vision and normal eye pressure. Call with any problems or concerns. Central corneal edema as expected for mature cataract Muro 128 ointment PRN to help with swelling. Continue Pred-Moxi-Brom 2x/day for 3 more weeks.  2.  Cataract accounts for the patient's decreased vision. This visual impairment is not correctable with a tolerable change in glasses or contact lenses. Cataract surgery with an  implantation of a new lens should significantly improve the visual and functional status of the patient. Discussed all risks, benefits, alternatives, and potential complications. Discussed the procedures and recovery. Patient desires to have surgery. A-scan ordered and performed today for intra-ocular lens calculations. The surgery will be performed in order to improve vision for driving, reading, and for eye examinations. Recommend phacoemulsification with intra-ocular lens. Recommend Dextenza for post-operative pain and inflammation. Left Eye. Surgery required to correct imbalance of vision. Dilates poorly - shugacaine by protocol. Vision [employer]. Malyugin Ring.

## 2023-12-10 ENCOUNTER — Encounter (HOSPITAL_COMMUNITY)
Admission: RE | Disposition: A | Discharge status: Home / Self Care | Payer: Self-pay | Source: Home / Self Care | Attending: Ophthalmology

## 2023-12-10 ENCOUNTER — Ambulatory Visit (HOSPITAL_COMMUNITY)
Admission: RE | Admit: 2023-12-10 | Discharge: 2023-12-10 | Disposition: A | Discharge status: Home / Self Care | Payer: BLUE CROSS/BLUE SHIELD | Attending: Ophthalmology | Admitting: Ophthalmology

## 2023-12-10 ENCOUNTER — Ambulatory Visit (HOSPITAL_COMMUNITY): Admitting: Certified Registered"

## 2023-12-10 ENCOUNTER — Ambulatory Visit (HOSPITAL_BASED_OUTPATIENT_CLINIC_OR_DEPARTMENT_OTHER): Admitting: Certified Registered"

## 2023-12-10 DIAGNOSIS — I10 Essential (primary) hypertension: Secondary | ICD-10-CM | POA: Insufficient documentation

## 2023-12-10 DIAGNOSIS — H2589 Other age-related cataract: Secondary | ICD-10-CM

## 2023-12-10 DIAGNOSIS — H2512 Age-related nuclear cataract, left eye: Secondary | ICD-10-CM | POA: Diagnosis not present

## 2023-12-10 DIAGNOSIS — E1165 Type 2 diabetes mellitus with hyperglycemia: Secondary | ICD-10-CM | POA: Diagnosis not present

## 2023-12-10 DIAGNOSIS — E1141 Type 2 diabetes mellitus with diabetic mononeuropathy: Secondary | ICD-10-CM | POA: Insufficient documentation

## 2023-12-10 DIAGNOSIS — H2522 Age-related cataract, morgagnian type, left eye: Secondary | ICD-10-CM | POA: Diagnosis not present

## 2023-12-10 DIAGNOSIS — F17200 Nicotine dependence, unspecified, uncomplicated: Secondary | ICD-10-CM | POA: Diagnosis not present

## 2023-12-10 DIAGNOSIS — E119 Type 2 diabetes mellitus without complications: Secondary | ICD-10-CM | POA: Diagnosis not present

## 2023-12-10 DIAGNOSIS — H2181 Floppy iris syndrome: Secondary | ICD-10-CM | POA: Insufficient documentation

## 2023-12-10 DIAGNOSIS — F17210 Nicotine dependence, cigarettes, uncomplicated: Secondary | ICD-10-CM

## 2023-12-10 DIAGNOSIS — E1136 Type 2 diabetes mellitus with diabetic cataract: Secondary | ICD-10-CM | POA: Diagnosis not present

## 2023-12-10 DIAGNOSIS — H259 Unspecified age-related cataract: Secondary | ICD-10-CM | POA: Diagnosis not present

## 2023-12-10 HISTORY — PX: CATARACT EXTRACTION W/PHACO: SHX586

## 2023-12-10 LAB — GLUCOSE, CAPILLARY
Glucose-Capillary: 268 mg/dL — ABNORMAL HIGH (ref 70–99)
Glucose-Capillary: 190 mg/dL — ABNORMAL HIGH (ref 70–99)

## 2023-12-10 SURGERY — PHACOEMULSIFICATION, CATARACT, WITH IOL INSERTION
Anesthesia: Monitor Anesthesia Care | Site: Eye | Laterality: Left

## 2023-12-10 MED ORDER — TETRACAINE HCL 0.5 % OP SOLN
1.0000 [drp] | OPHTHALMIC | Status: AC | PRN
  Administered 2023-12-10 (×3): 1 [drp] via OPHTHALMIC

## 2023-12-10 MED ORDER — POVIDONE-IODINE 5 % OP SOLN
OPHTHALMIC | Status: DC | PRN
  Administered 2023-12-10: 1 via OPHTHALMIC

## 2023-12-10 MED ORDER — SODIUM HYALURONATE 10 MG/ML IO SOLUTION
PREFILLED_SYRINGE | INTRAOCULAR | Status: DC | PRN
  Administered 2023-12-10: .85 mL via INTRAOCULAR

## 2023-12-10 MED ORDER — BSS IO SOLN
INTRAOCULAR | Status: DC | PRN
  Administered 2023-12-10: 15 mL via INTRAOCULAR

## 2023-12-10 MED ORDER — MIDAZOLAM HCL 2 MG/2ML IJ SOLN
INTRAMUSCULAR | Status: DC | PRN
  Administered 2023-12-10: 1 mg via INTRAVENOUS

## 2023-12-10 MED ORDER — PHENYLEPHRINE HCL 2.5 % OP SOLN
1.0000 [drp] | OPHTHALMIC | Status: AC | PRN
  Administered 2023-12-10 (×3): 1 [drp] via OPHTHALMIC

## 2023-12-10 MED ORDER — LIDOCAINE HCL (PF) 1 % IJ SOLN
INTRAOCULAR | Status: DC | PRN
  Administered 2023-12-10: 1 mL via OPHTHALMIC

## 2023-12-10 MED ORDER — MIDAZOLAM HCL 2 MG/2ML IJ SOLN
INTRAMUSCULAR | Status: AC
  Filled 2023-12-10: qty 2

## 2023-12-10 MED ORDER — STERILE WATER FOR IRRIGATION IR SOLN
Status: DC | PRN
  Administered 2023-12-10: 250 mL

## 2023-12-10 MED ORDER — TROPICAMIDE 1 % OP SOLN
1.0000 [drp] | OPHTHALMIC | Status: AC | PRN
  Administered 2023-12-10 (×3): 1 [drp] via OPHTHALMIC

## 2023-12-10 MED ORDER — MOXIFLOXACIN HCL 5 MG/ML IO SOLN
INTRAOCULAR | Status: DC | PRN
  Administered 2023-12-10: .3 mL via INTRACAMERAL

## 2023-12-10 MED ORDER — SODIUM CHLORIDE 0.9% FLUSH
INTRAVENOUS | Status: DC | PRN
Start: 2023-12-10 — End: 2023-12-10
  Administered 2023-12-10: 8 mL via INTRAVENOUS

## 2023-12-10 MED ORDER — SODIUM HYALURONATE 23MG/ML IO SOSY
PREFILLED_SYRINGE | INTRAOCULAR | Status: DC | PRN
  Administered 2023-12-10: .6 mL via INTRAOCULAR

## 2023-12-10 MED ORDER — PHENYLEPHRINE-KETOROLAC 1-0.3 % IO SOLN
INTRAOCULAR | Status: DC | PRN
  Administered 2023-12-10: 500 mL via OPHTHALMIC

## 2023-12-10 MED ORDER — LIDOCAINE HCL 3.5 % OP GEL
1.0000 | Freq: Once | OPHTHALMIC | Status: AC
  Administered 2023-12-10: 1 via OPHTHALMIC

## 2023-12-10 MED ORDER — TRYPAN BLUE 0.06 % IO SOSY
PREFILLED_SYRINGE | INTRAOCULAR | Status: DC | PRN
  Administered 2023-12-10: .5 mL via INTRAOCULAR

## 2023-12-10 SURGICAL SUPPLY — 13 items
CATARACT SUITE SIGHTPATH (MISCELLANEOUS) ×1 IMPLANT
CLOTH BEACON ORANGE TIMEOUT ST (SAFETY) ×1 IMPLANT
EYE SHIELD UNIVERSAL CLEAR (GAUZE/BANDAGES/DRESSINGS) IMPLANT
FEE CATARACT SUITE SIGHTPATH (MISCELLANEOUS) ×1 IMPLANT
GLOVE BIOGEL PI IND STRL 7.0 (GLOVE) ×2 IMPLANT
LENS IOL TECNIS EYHANCE 19.0 (Intraocular Lens) IMPLANT
NDL HYPO 18GX1.5 BLUNT FILL (NEEDLE) ×1 IMPLANT
NEEDLE HYPO 18GX1.5 BLUNT FILL (NEEDLE) ×1 IMPLANT
PAD ARMBOARD POSITIONER FOAM (MISCELLANEOUS) ×1 IMPLANT
RING MALYGIN 7.0 (MISCELLANEOUS) IMPLANT
SYR TB 1ML LL NO SAFETY (SYRINGE) ×1 IMPLANT
TAPE SURG TRANSPORE 1 IN (GAUZE/BANDAGES/DRESSINGS) IMPLANT
WATER STERILE IRR 250ML POUR (IV SOLUTION) ×1 IMPLANT

## 2023-12-10 NOTE — Op Note (Signed)
 Date of procedure: 12/10/23  Pre-operative diagnosis: Visually significant mature cataract, Left Eye; Poor dilation, Left eye (H25.?2)   Post-operative diagnosis: Visually significant mature cataract, Left Eye; Intra-operative Floppy Iris Syndrome, Left Eye (H21.81)  Procedure: Complex removal of cataract via phacoemulsification and insertion of intra-ocular lens [employer] DIB00 +19.0D into the capsular bag of the Left Eye (CPT [id number])  Attending surgeon: Rudy Jew. Ahmari Duerson, MD, MA  Anesthesia: MAC, Topical Akten  Complications: None  Estimated Blood Loss: <41mL (minimal)  Specimens: None  Implants: As above  Indications:  Mature cataract, Left Eye  Procedure:  The patient was seen and identified in the pre-operative area. The operative eye was identified and dilated.  The operative eye was marked.  Topical anesthesia was administered to the operative eye.     The patient was then to the operative suite and placed in the supine position.  A timeout was performed confirming the patient, procedure to be performed, and all other relevant information.   The patient's face was prepped and draped in the usual fashion for intra-ocular surgery.  A lid speculum was placed into the operative eye and the surgical microscope moved into place and focused.  Poor dilation of the iris was confirmed.  An inferotemporal paracentesis was created using a 20 gauge paracentesis blade. Vision Blue was used to stain the anterior capsule.  The Vision Blue was irrigated from the anterior chamber using BSS. Shugarcaine was injected into the anterior chamber.  Viscoelastic was injected into the anterior chamber.  A temporal clear-corneal main wound incision was created using a 2.48mm microkeratome.  A Malyugin ring was placed.  A continuous curvilinear capsulorrhexis was initiated using an irrigating cystitome and completed using capsulorrhexis forceps.  Hydrodissection and hydrodeliniation were performed.   Viscoelastic was injected into the anterior chamber.  A phacoemulsification handpiece and a chopper as a second instrument were used to remove the nucleus and epinucleus. The irrigation/aspiration handpiece was used to remove any remaining cortical material.   The capsular bag was reinflated with viscoelastic, checked, and found to be intact.  The intraocular lens was inserted into the capsular bag and dialed into place using a Customer service manager. The Malyugin ring was removed.  The irrigation/aspiration handpiece was used to remove any remaining viscoelastic.  The clear corneal wound and paracentesis wounds were then hydrated and checked with Weck-Cels to be watertight. 0.7mL of Moxifloxacin was injected into the anterior chamber. The lid-speculum and drape was removed, and the patient's face was cleaned with a wet and dry 4x4. A clear shield was taped over the eye. The patient was taken to the post-operative care unit in good condition, having tolerated the procedure well.  Post-Op Instructions: The patient will follow up at [facility] for a same day post-operative evaluation and will receive all other orders and instructions.

## 2023-12-10 NOTE — Anesthesia Postprocedure Evaluation (Signed)
 Anesthesia Post Note  Patient: Dylan Roberts  Procedure(s) Performed: PHACOEMULSIFICATION, CATARACT, WITH IOL INSERTION (Left: Eye)  Patient location during evaluation: PACU Anesthesia Type: MAC Level of consciousness: awake and alert Pain management: pain level controlled Vital Signs Assessment: post-procedure vital signs reviewed and stable Respiratory status: spontaneous breathing, nonlabored ventilation, respiratory function stable and patient connected to nasal cannula oxygen Cardiovascular status: stable and blood pressure returned to baseline Postop Assessment: no apparent nausea or vomiting Anesthetic complications: no   There were no known notable events for this encounter.   Last Vitals:  Vitals:   12/10/23 1230 12/10/23 1345  BP: 139/86 (!) 212/116  Pulse: 85 84  Resp: 17 20  Temp:  36.9 C  SpO2: 100% 100%    Last Pain:  Vitals:   12/10/23 1345  TempSrc: Oral  PainSc: 0-No pain                 Lux Meaders L Jordyan Hardiman

## 2023-12-10 NOTE — Discharge Instructions (Addendum)
 Please discharge patient when stable, will follow up today with Dr. June Leap at the [facility] office immediately following discharge.  Leave shield in place until visit.  All paperwork with discharge instructions will be given at the office.  [facility] Address:  [address]  [place], [place] [address]

## 2023-12-10 NOTE — Interval H&P Note (Signed)
 History and Physical Interval Note:  12/10/2023 1:05 PM  Dylan Roberts  has presented today for surgery, with the diagnosis of morgagnian hypermature age related cataract, left eye.  The various methods of treatment have been discussed with the patient and family. After consideration of risks, benefits and other options for treatment, the patient has consented to  Procedure(s) with comments: PHACOEMULSIFICATION, CATARACT, WITH IOL INSERTION (Left) - complex case / call wife Misty Stanley with any information needed: [phone] as a surgical intervention.  The patient's history has been reviewed, patient examined, no change in status, stable for surgery.  I have reviewed the patient's chart and labs.  Questions were answered to the patient's satisfaction.     Dylan Roberts

## 2023-12-10 NOTE — Anesthesia Procedure Notes (Signed)
 Procedure Name: MAC Date/Time: 12/10/2023 1:09 PM  Performed by: Julian Reil, CRNAPre-anesthesia Checklist: Patient identified, Emergency Drugs available, Suction available and Patient being monitored Patient Re-evaluated:Patient Re-evaluated prior to induction Oxygen Delivery Method: Nasal cannula Placement Confirmation: positive ETCO2

## 2023-12-10 NOTE — Anesthesia Preprocedure Evaluation (Addendum)
 Anesthesia Evaluation  Patient identified by MRN, date of birth, ID band Patient awake    Reviewed: Allergy & Precautions, H&P , NPO status , Patient's Chart, lab work & pertinent test results, reviewed documented beta blocker date and time   Airway Mallampati: II  TM Distance: >3 FB Neck ROM: full    Dental no notable dental hx. (+) Dental Advisory Given, Teeth Intact   Pulmonary Current Smoker and Patient abstained from smoking.   Pulmonary exam normal breath sounds clear to auscultation       Cardiovascular Exercise Tolerance: Good hypertension, Normal cardiovascular exam Rhythm:regular Rate:Normal     Neuro/Psych negative neurological ROS  negative psych ROS   GI/Hepatic negative GI ROS, Neg liver ROS,,,  Endo/Other  diabetes, Poorly Controlled, Type 2    Renal/GU negative Renal ROS  negative genitourinary   Musculoskeletal   Abdominal   Peds  Hematology negative hematology ROS (+)   Anesthesia Other Findings   Reproductive/Obstetrics negative OB ROS                              Anesthesia Physical Anesthesia Plan  ASA: 3  Anesthesia Plan: MAC   Post-op Pain Management: Minimal or no pain anticipated   Induction:   PONV Risk Score and Plan: Midazolam  Airway Management Planned: Nasal Cannula and Natural Airway  Additional Equipment: None  Intra-op Plan:   Post-operative Plan:   Informed Consent: I have reviewed the patients History and Physical, chart, labs and discussed the procedure including the risks, benefits and alternatives for the proposed anesthesia with the patient or authorized representative who has indicated his/her understanding and acceptance.     Dental Advisory Given  Plan Discussed with: CRNA  Anesthesia Plan Comments:         Anesthesia Quick Evaluation

## 2023-12-10 NOTE — Transfer of Care (Signed)
 Immediate Anesthesia Transfer of Care Note  Patient: Dylan Roberts  Procedure(s) Performed: PHACOEMULSIFICATION, CATARACT, WITH IOL INSERTION (Left: Eye)  Patient Location: Short Stay  Anesthesia Type:MAC  Level of Consciousness: awake, alert , and oriented  Airway & Oxygen Therapy: Patient Spontanous Breathing  Post-op Assessment: Report given to RN and Post -op Vital signs reviewed and stable  Post vital signs: Reviewed and stable  Last Vitals:  Vitals Value Taken Time  BP    Temp    Pulse    Resp    SpO2      Last Pain:  Vitals:   12/10/23 1215  PainSc: 0-No pain         Complications: No notable events documented.

## 2023-12-13 ENCOUNTER — Encounter (HOSPITAL_COMMUNITY): Payer: Self-pay | Admitting: Ophthalmology

## 2023-12-26 ENCOUNTER — Emergency Department (HOSPITAL_COMMUNITY)

## 2023-12-26 ENCOUNTER — Other Ambulatory Visit: Payer: Self-pay

## 2023-12-26 ENCOUNTER — Inpatient Hospital Stay (HOSPITAL_COMMUNITY)
Admission: EM | Admit: 2023-12-26 | Discharge: 2024-01-06 | DRG: 064 | Disposition: E | Attending: Neurology | Admitting: Neurology

## 2023-12-26 ENCOUNTER — Encounter (HOSPITAL_COMMUNITY): Payer: Self-pay | Admitting: Emergency Medicine

## 2023-12-26 DIAGNOSIS — I610 Nontraumatic intracerebral hemorrhage in hemisphere, subcortical: Secondary | ICD-10-CM | POA: Diagnosis not present

## 2023-12-26 DIAGNOSIS — E7800 Pure hypercholesterolemia, unspecified: Secondary | ICD-10-CM | POA: Diagnosis present

## 2023-12-26 DIAGNOSIS — R29724 NIHSS score 24: Secondary | ICD-10-CM | POA: Diagnosis not present

## 2023-12-26 DIAGNOSIS — I959 Hypotension, unspecified: Secondary | ICD-10-CM | POA: Diagnosis not present

## 2023-12-26 DIAGNOSIS — G9349 Other encephalopathy: Secondary | ICD-10-CM | POA: Diagnosis present

## 2023-12-26 DIAGNOSIS — E1165 Type 2 diabetes mellitus with hyperglycemia: Secondary | ICD-10-CM | POA: Diagnosis present

## 2023-12-26 DIAGNOSIS — I10 Essential (primary) hypertension: Secondary | ICD-10-CM | POA: Diagnosis not present

## 2023-12-26 DIAGNOSIS — I615 Nontraumatic intracerebral hemorrhage, intraventricular: Secondary | ICD-10-CM | POA: Diagnosis present

## 2023-12-26 DIAGNOSIS — I161 Hypertensive emergency: Secondary | ICD-10-CM | POA: Diagnosis present

## 2023-12-26 DIAGNOSIS — E871 Hypo-osmolality and hyponatremia: Secondary | ICD-10-CM | POA: Diagnosis present

## 2023-12-26 DIAGNOSIS — G8324 Monoplegia of upper limb affecting left nondominant side: Secondary | ICD-10-CM | POA: Diagnosis present

## 2023-12-26 DIAGNOSIS — Z515 Encounter for palliative care: Secondary | ICD-10-CM | POA: Diagnosis not present

## 2023-12-26 DIAGNOSIS — F17210 Nicotine dependence, cigarettes, uncomplicated: Secondary | ICD-10-CM | POA: Diagnosis present

## 2023-12-26 DIAGNOSIS — N179 Acute kidney failure, unspecified: Secondary | ICD-10-CM | POA: Diagnosis not present

## 2023-12-26 DIAGNOSIS — G919 Hydrocephalus, unspecified: Secondary | ICD-10-CM | POA: Diagnosis not present

## 2023-12-26 DIAGNOSIS — I613 Nontraumatic intracerebral hemorrhage in brain stem: Secondary | ICD-10-CM | POA: Diagnosis not present

## 2023-12-26 DIAGNOSIS — E1140 Type 2 diabetes mellitus with diabetic neuropathy, unspecified: Secondary | ICD-10-CM | POA: Diagnosis present

## 2023-12-26 DIAGNOSIS — G935 Compression of brain: Secondary | ICD-10-CM | POA: Diagnosis not present

## 2023-12-26 DIAGNOSIS — I63531 Cerebral infarction due to unspecified occlusion or stenosis of right posterior cerebral artery: Secondary | ICD-10-CM | POA: Diagnosis present

## 2023-12-26 DIAGNOSIS — G253 Myoclonus: Secondary | ICD-10-CM | POA: Diagnosis present

## 2023-12-26 DIAGNOSIS — I629 Nontraumatic intracranial hemorrhage, unspecified: Secondary | ICD-10-CM | POA: Diagnosis not present

## 2023-12-26 DIAGNOSIS — J9601 Acute respiratory failure with hypoxia: Secondary | ICD-10-CM | POA: Diagnosis present

## 2023-12-26 DIAGNOSIS — E872 Acidosis, unspecified: Secondary | ICD-10-CM | POA: Diagnosis present

## 2023-12-26 DIAGNOSIS — R001 Bradycardia, unspecified: Secondary | ICD-10-CM | POA: Diagnosis present

## 2023-12-26 DIAGNOSIS — I619 Nontraumatic intracerebral hemorrhage, unspecified: Secondary | ICD-10-CM | POA: Diagnosis present

## 2023-12-26 DIAGNOSIS — H547 Unspecified visual loss: Secondary | ICD-10-CM | POA: Diagnosis present

## 2023-12-26 DIAGNOSIS — R1111 Vomiting without nausea: Secondary | ICD-10-CM | POA: Diagnosis not present

## 2023-12-26 DIAGNOSIS — R404 Transient alteration of awareness: Secondary | ICD-10-CM | POA: Diagnosis not present

## 2023-12-26 DIAGNOSIS — Z4682 Encounter for fitting and adjustment of non-vascular catheter: Secondary | ICD-10-CM | POA: Diagnosis not present

## 2023-12-26 DIAGNOSIS — R1310 Dysphagia, unspecified: Secondary | ICD-10-CM | POA: Diagnosis present

## 2023-12-26 DIAGNOSIS — R4182 Altered mental status, unspecified: Secondary | ICD-10-CM | POA: Diagnosis not present

## 2023-12-26 DIAGNOSIS — R0902 Hypoxemia: Secondary | ICD-10-CM | POA: Diagnosis not present

## 2023-12-26 DIAGNOSIS — E876 Hypokalemia: Secondary | ICD-10-CM | POA: Diagnosis not present

## 2023-12-26 DIAGNOSIS — Z66 Do not resuscitate: Secondary | ICD-10-CM | POA: Diagnosis not present

## 2023-12-26 DIAGNOSIS — Z79899 Other long term (current) drug therapy: Secondary | ICD-10-CM

## 2023-12-26 DIAGNOSIS — R29818 Other symptoms and signs involving the nervous system: Secondary | ICD-10-CM | POA: Diagnosis not present

## 2023-12-26 DIAGNOSIS — J9600 Acute respiratory failure, unspecified whether with hypoxia or hypercapnia: Secondary | ICD-10-CM | POA: Insufficient documentation

## 2023-12-26 DIAGNOSIS — I6389 Other cerebral infarction: Secondary | ICD-10-CM | POA: Diagnosis not present

## 2023-12-26 LAB — APTT: aPTT: 25 s (ref 24–36)

## 2023-12-26 LAB — CBC WITH DIFFERENTIAL/PLATELET
Abs Immature Granulocytes: 0.08 10*3/uL — ABNORMAL HIGH (ref 0.00–0.07)
Basophils Absolute: 0.1 10*3/uL (ref 0.0–0.1)
Basophils Relative: 1 %
Eosinophils Absolute: 0.2 10*3/uL (ref 0.0–0.5)
Eosinophils Relative: 2 %
HCT: 39.2 % (ref 39.0–52.0)
Hemoglobin: 13.1 g/dL (ref 13.0–17.0)
Immature Granulocytes: 1 %
Lymphocytes Relative: 40 %
Lymphs Abs: 4.4 10*3/uL — ABNORMAL HIGH (ref 0.7–4.0)
MCH: 30.4 pg (ref 26.0–34.0)
MCHC: 33.4 g/dL (ref 30.0–36.0)
MCV: 91 fL (ref 80.0–100.0)
Monocytes Absolute: 0.5 10*3/uL (ref 0.1–1.0)
Monocytes Relative: 5 %
Neutro Abs: 5.8 10*3/uL (ref 1.7–7.7)
Neutrophils Relative %: 51 %
Platelets: 295 10*3/uL (ref 150–400)
RBC: 4.31 MIL/uL (ref 4.22–5.81)
RDW: 13 % (ref 11.5–15.5)
WBC: 11.1 10*3/uL — ABNORMAL HIGH (ref 4.0–10.5)
nRBC: 0 % (ref 0.0–0.2)

## 2023-12-26 LAB — COMPREHENSIVE METABOLIC PANEL WITH GFR
ALT: 42 U/L (ref 0–44)
AST: 37 U/L (ref 15–41)
Albumin: 2.1 g/dL — ABNORMAL LOW (ref 3.5–5.0)
Alkaline Phosphatase: 171 U/L — ABNORMAL HIGH (ref 38–126)
Anion gap: 6 (ref 5–15)
BUN: 17 mg/dL (ref 6–20)
CO2: 26 mmol/L (ref 22–32)
Calcium: 8.4 mg/dL — ABNORMAL LOW (ref 8.9–10.3)
Chloride: 96 mmol/L — ABNORMAL LOW (ref 98–111)
Creatinine, Ser: 0.54 mg/dL — ABNORMAL LOW (ref 0.61–1.24)
GFR, Estimated: >60 mL/min (ref >60)
Glucose, Bld: 129 mg/dL — ABNORMAL HIGH (ref 70–99)
Potassium: 3.6 mmol/L (ref 3.5–5.1)
Sodium: 128 mmol/L — ABNORMAL LOW (ref 135–145)
Total Bilirubin: 0.8 mg/dL (ref 0.0–1.2)
Total Protein: 6 g/dL — ABNORMAL LOW (ref 6.5–8.1)

## 2023-12-26 LAB — PROTIME-INR
INR: 0.9 (ref 0.8–1.2)
Prothrombin Time: 12.7 s (ref 11.4–15.2)

## 2023-12-26 LAB — RAPID URINE DRUG SCREEN, HOSP PERFORMED
Amphetamines: NOT DETECTED
Barbiturates: NOT DETECTED
Benzodiazepines: NOT DETECTED
Cocaine: NOT DETECTED
Opiates: NOT DETECTED
Tetrahydrocannabinol: NOT DETECTED

## 2023-12-26 LAB — POCT I-STAT 7, (LYTES, BLD GAS, ICA,H+H)
Acid-base deficit: 8 mmol/L — ABNORMAL HIGH (ref 0.0–2.0)
Bicarbonate: 17.5 mmol/L — ABNORMAL LOW (ref 20.0–28.0)
Calcium, Ion: 1.34 mmol/L (ref 1.15–1.40)
HCT: 39 % (ref 39.0–52.0)
Hemoglobin: 13.3 g/dL (ref 13.0–17.0)
O2 Saturation: 96 %
Patient temperature: 36.4
Potassium: 4.4 mmol/L (ref 3.5–5.1)
Sodium: 132 mmol/L — ABNORMAL LOW (ref 135–145)
TCO2: 19 mmol/L — ABNORMAL LOW (ref 22–32)
pCO2 arterial: 33.9 mmHg (ref 32–48)
pH, Arterial: 7.317 — ABNORMAL LOW (ref 7.35–7.45)
pO2, Arterial: 82 mmHg — ABNORMAL LOW (ref 83–108)

## 2023-12-26 LAB — BLOOD GAS, VENOUS
Acid-base deficit: 11.6 mmol/L — ABNORMAL HIGH (ref 0.0–2.0)
Bicarbonate: 18.4 mmol/L — ABNORMAL LOW (ref 20.0–28.0)
Drawn by: 66615
O2 Saturation: 72.5 %
Patient temperature: 36.6
pCO2, Ven: 57 mmHg (ref 44–60)
pH, Ven: 7.11 — CL (ref 7.25–7.43)
pO2, Ven: 49 mmHg — ABNORMAL HIGH (ref 32–45)

## 2023-12-26 LAB — LACTIC ACID, PLASMA: Lactic Acid, Venous: 3.3 mmol/L (ref 0.5–1.9)

## 2023-12-26 LAB — TROPONIN I (HIGH SENSITIVITY): Troponin I (High Sensitivity): 105 ng/L (ref <18)

## 2023-12-26 LAB — CBG MONITORING, ED: Glucose-Capillary: 176 mg/dL — ABNORMAL HIGH (ref 70–99)

## 2023-12-26 LAB — ETHANOL: Alcohol, Ethyl (B): 16 mg/dL — ABNORMAL HIGH (ref <10)

## 2023-12-26 MED ORDER — PROPOFOL 1000 MG/100ML IV EMUL
5.0000 ug/kg/min | INTRAVENOUS | Status: DC
  Administered 2023-12-26: 5 ug/kg/min via INTRAVENOUS
  Administered 2023-12-27 (×2): 40 ug/kg/min via INTRAVENOUS
  Administered 2023-12-28: 25 ug/kg/min via INTRAVENOUS
  Filled 2023-12-26 (×5): qty 100

## 2023-12-26 MED ORDER — ORAL CARE MOUTH RINSE: 15.0000 mL | OROMUCOSAL | Status: DC | PRN

## 2023-12-26 MED ORDER — EPINEPHRINE HCL 5 MG/250ML IV SOLN IN NS: 0.5000 ug/min | INTRAVENOUS | Status: DC

## 2023-12-26 MED ORDER — EPINEPHRINE HCL 5 MG/250ML IV SOLN IN NS
INTRAVENOUS | Status: AC
  Filled 2023-12-26: qty 250

## 2023-12-26 MED ORDER — ORAL CARE MOUTH RINSE
15.0000 mL | OROMUCOSAL | Status: DC
  Administered 2023-12-26 – 2023-12-28 (×16): 15 mL via OROMUCOSAL

## 2023-12-26 MED ORDER — INSULIN ASPART 100 UNIT/ML IJ SOLN
0.0000 [IU] | INTRAMUSCULAR | Status: DC
  Administered 2023-12-27: 5 [IU] via SUBCUTANEOUS
  Administered 2023-12-27: 3 [IU] via SUBCUTANEOUS
  Administered 2023-12-27 (×2): 5 [IU] via SUBCUTANEOUS
  Administered 2023-12-27 (×2): 8 [IU] via SUBCUTANEOUS
  Administered 2023-12-28 (×2): 5 [IU] via SUBCUTANEOUS

## 2023-12-26 MED ORDER — SENNOSIDES-DOCUSATE SODIUM 8.6-50 MG PO TABS
1.0000 | ORAL_TABLET | Freq: Two times a day (BID) | ORAL | Status: DC
  Administered 2023-12-27: 1
  Filled 2023-12-26: qty 1

## 2023-12-26 MED ORDER — LEVETIRACETAM IN NACL 1000 MG/100ML IV SOLN
INTRAVENOUS | Status: AC
Start: 2023-12-26 — End: 2023-12-26
  Administered 2023-12-26: 1000 mg via INTRAVENOUS
  Filled 2023-12-26: qty 100

## 2023-12-26 MED ORDER — NITROGLYCERIN IN D5W 200-5 MCG/ML-% IV SOLN
INTRAVENOUS | Status: AC
  Filled 2023-12-26: qty 250

## 2023-12-26 MED ORDER — PANTOPRAZOLE SODIUM 40 MG IV SOLR
40.0000 mg | INTRAVENOUS | Status: DC
  Administered 2023-12-27 (×2): 40 mg via INTRAVENOUS
  Filled 2023-12-26 (×2): qty 10

## 2023-12-26 MED ORDER — NOREPINEPHRINE 4 MG/250ML-% IV SOLN
0.0000 ug/min | INTRAVENOUS | Status: DC
  Administered 2023-12-26: 2 ug/min via INTRAVENOUS
  Filled 2023-12-26: qty 250

## 2023-12-26 MED ORDER — CLEVIDIPINE BUTYRATE 0.5 MG/ML IV EMUL
0.0000 mg/h | INTRAVENOUS | Status: DC
  Administered 2023-12-26: 2 mg/h via INTRAVENOUS
  Administered 2023-12-27: 21 mg/h via INTRAVENOUS
  Administered 2023-12-27: 32 mg/h via INTRAVENOUS
  Administered 2023-12-27: 29 mg/h via INTRAVENOUS
  Administered 2023-12-27: 10 mg/h via INTRAVENOUS
  Administered 2023-12-27: 28 mg/h via INTRAVENOUS
  Administered 2023-12-27: 27 mg/h via INTRAVENOUS
  Administered 2023-12-27: 25 mg/h via INTRAVENOUS
  Administered 2023-12-27: 20 mg/h via INTRAVENOUS
  Administered 2023-12-27: 18 mg/h via INTRAVENOUS
  Administered 2023-12-27: 25 mg/h via INTRAVENOUS
  Administered 2023-12-27: 29 mg/h via INTRAVENOUS
  Administered 2023-12-28: 10 mg/h via INTRAVENOUS
  Administered 2023-12-28: 14 mg/h via INTRAVENOUS
  Administered 2023-12-28: 12 mg/h via INTRAVENOUS
  Administered 2023-12-28: 23 mg/h via INTRAVENOUS
  Administered 2023-12-28: 25 mg/h via INTRAVENOUS
  Filled 2023-12-26: qty 100
  Filled 2023-12-26 (×2): qty 50
  Filled 2023-12-26: qty 100
  Filled 2023-12-26 (×12): qty 50

## 2023-12-26 MED ORDER — ACETAMINOPHEN 160 MG/5ML PO SOLN: 650.0000 mg | ORAL | Status: DC | PRN

## 2023-12-26 MED ORDER — SODIUM CHLORIDE 3 % IV SOLN
INTRAVENOUS | Status: DC
  Filled 2023-12-26 (×3): qty 500

## 2023-12-26 MED ORDER — LEVETIRACETAM IN NACL 1000 MG/100ML IV SOLN: 1000.0000 mg | Freq: Once | INTRAVENOUS | Status: AC

## 2023-12-26 MED ORDER — ACETAMINOPHEN 325 MG PO TABS: 650.0000 mg | ORAL_TABLET | ORAL | Status: DC | PRN

## 2023-12-26 MED ORDER — SODIUM CHLORIDE 3 % IV SOLN
INTRAVENOUS | Status: DC
  Filled 2023-12-26 (×4): qty 500

## 2023-12-26 MED ORDER — ACETAMINOPHEN 650 MG RE SUPP: 650.0000 mg | RECTAL | Status: DC | PRN

## 2023-12-26 MED ORDER — CHLORHEXIDINE GLUCONATE CLOTH 2 % EX PADS
6.0000 | MEDICATED_PAD | Freq: Every day | CUTANEOUS | Status: DC
  Administered 2023-12-26 – 2023-12-27 (×2): 6 via TOPICAL

## 2023-12-26 MED ORDER — STROKE: EARLY STAGES OF RECOVERY BOOK: Freq: Once | Status: DC

## 2023-12-26 NOTE — ED Provider Notes (Signed)
 [facility] AT [facility] Provider Note   CSN: [id number] Arrival date & time: 12/26/23  1925     History  Chief Complaint  Patient presents with   Respiratory Arrest    Dylan Roberts is a 58 y.o. male.  HPI   This patient is a 59 year old male with a history of untreated hypertension and diabetes secondary to lack of access to care.  He was at home today when he developed acute onset of not being able to see and weakness of his left arm, he had one of his grandchildren going get his wife who came into the room, he was not able to use his left arm, stated he felt very poorly and to call 911.  The patient then became unresponsive for the paramedics.  He was breathing by himself, had good pulses and was noted to be severely hypertension well over 200 systolic.  Within minutes of being on the ambulance the patient stopped breathing and required bag-valve-mask assisted ventilation for about 13 minutes prehospital, he did not need any CPR as he had pulses the entire way.  On arrival the patient's blood pressure was severely elevated, his heart rate was in the 40s and then he had severe hypotension that required a dose of epinephrine .  He never lost pulses.  The patient is unable to answer any questions and was intubated on arrival for airway protection as he had vomited and probably aspirated and route to the hospital.  His blood sugar was normal on the ambulance, his EKG was shared with me and showed no signs of STEMI  Home Medications Prior to Admission medications   Medication Sig Start Date End Date Taking? Authorizing Provider  gabapentin  (NEURONTIN ) 300 MG capsule Take 1 capsule (300 mg total) by mouth 3 (three) times daily. 1 pill day 1, 2 pills day 2 then three times a day. Patient not taking: Reported on 08/28/2016 06/11/16   Mozell Arias, MD  glipiZIDE (GLUCOTROL) 10 MG tablet Take 10 mg by mouth 2 (two) times daily before a meal.  08/03/16    [provider]  HYDROcodone -acetaminophen  (NORCO) 5-325 MG tablet Take 1 tablet by mouth every 4 (four) hours as needed. 08/28/16   Carlton Chick, MD  ibuprofen (ADVIL,MOTRIN) 200 MG tablet Take 800 mg by mouth daily as needed for mild pain.    [provider]  lisinopril (PRINIVIL,ZESTRIL) 5 MG tablet Take 5 mg by mouth daily.  08/03/16   [provider]  metFORMIN  (GLUCOPHAGE ) 500 MG tablet Take 1 tablet (500 mg total) by mouth 2 (two) times daily with a meal. 06/11/16   Mozell Arias, MD  pregabalin (LYRICA) 75 MG capsule Take 75 mg by mouth 2 (two) times daily.    [provider]  SITagliptin Phosphate (JANUVIA PO) Take 1 tablet by mouth daily.    [provider]      Allergies    Patient has no known allergies.    Review of Systems   Review of Systems  All other systems reviewed and are negative.   Physical Exam Updated Vital Signs BP 103/77   Pulse (!) 113   Temp 97.9 F (36.6 C) (Axillary)   Resp (!) 24   Ht 1.727 m (5\' 8" )   Wt 70 kg   SpO2 100%   BMI 23.46 kg/m  Physical Exam Vitals and nursing note reviewed.  Constitutional:      General: He is in acute distress.  Appearance: He is well-developed. He is ill-appearing and toxic-appearing.  HENT:     Head: Normocephalic and atraumatic.     Mouth/Throat:     Pharynx: No oropharyngeal exudate.  Eyes:     General: No scleral icterus.       Right eye: No discharge.        Left eye: No discharge.     Conjunctiva/sclera: Conjunctivae normal.     Pupils: Pupils are equal, round, and reactive to light.  Neck:     Thyroid: No thyromegaly.     Vascular: No JVD.  Cardiovascular:     Rate and Rhythm: Regular rhythm. Bradycardia present.     Heart sounds: Normal heart sounds. No murmur heard.    No friction rub. No gallop.  Pulmonary:     Effort: No respiratory distress.     Breath sounds: No wheezing or rales.     Comments: Assisted respirations with  bag-valve-mask, gurgling breath sounds Abdominal:     General: Bowel sounds are normal. There is no distension.     Palpations: Abdomen is soft. There is no mass.     Tenderness: There is no abdominal tenderness.  Musculoskeletal:        General: No swelling. Normal range of motion.     Cervical back: Normal range of motion and neck supple.     Right lower leg: Edema present.     Left lower leg: Edema present.     Comments: Scant bilateral lower extremity pitting edema  Lymphadenopathy:     Cervical: No cervical adenopathy.  Skin:    General: Skin is warm and dry.     Coloration: Pallor: .milm.     Findings: No erythema or rash.  Neurological:     Comments: GCS of 3     ED Results / Procedures / Treatments   Labs (all labs ordered are listed, but only abnormal results are displayed) Labs Reviewed  COMPREHENSIVE METABOLIC PANEL WITH GFR - Abnormal; Notable for the following components:      Result Value   Sodium 128 (*)    Chloride 96 (*)    Glucose, Bld 129 (*)    Creatinine, Ser 0.54 (*)    Calcium 8.4 (*)    Total Protein 6.0 (*)    Albumin 2.1 (*)    Alkaline Phosphatase 171 (*)    All other components within normal limits  LACTIC ACID, PLASMA - Abnormal; Notable for the following components:   Lactic Acid, Venous 3.3 (*)    All other components within normal limits  CBC WITH DIFFERENTIAL/PLATELET - Abnormal; Notable for the following components:   WBC 11.1 (*)    Lymphs Abs 4.4 (*)    Abs Immature Granulocytes 0.08 (*)    All other components within normal limits  BLOOD GAS, VENOUS - Abnormal; Notable for the following components:   pH, Ven 7.11 (*)    pO2, Ven 49 (*)    Bicarbonate 18.4 (*)    Acid-base deficit 11.6 (*)    All other components within normal limits  ETHANOL - Abnormal; Notable for the following components:   Alcohol , Ethyl (B) 16 (*)    All other components within normal limits  CBG MONITORING, ED - Abnormal; Notable for the following  components:   Glucose-Capillary 176 (*)    All other components within normal limits  TROPONIN I (HIGH SENSITIVITY) - Abnormal; Notable for the following components:   Troponin I (High Sensitivity) 105 (*)    All  other components within normal limits  CULTURE, BLOOD (ROUTINE X 2)  CULTURE, BLOOD (ROUTINE X 2)  PROTIME-INR  APTT  LACTIC ACID, PLASMA  URINALYSIS, W/ REFLEX TO CULTURE (INFECTION SUSPECTED)  RAPID URINE DRUG SCREEN, HOSP PERFORMED  I-STAT CHEM 8, ED    EKG None  Radiology CT HEAD CODE STROKE WO CONTRAST` Result Date: 12/26/2023 CLINICAL DATA:  Code stroke.  Mental status change, unknown cause EXAM: CT HEAD WITHOUT CONTRAST TECHNIQUE: Contiguous axial images were obtained from the base of the skull through the vertex without intravenous contrast. RADIATION DOSE REDUCTION: This exam was performed according to the departmental dose-optimization program which includes automated exposure control, adjustment of the mA and/or kV according to patient size and/or use of iterative reconstruction technique. COMPARISON:  None Available. FINDINGS: Brain: Acute intraparenchymal hemorrhage in the pons which measures approximately 3.0 x 2.3 x 3.5 cm (estimated volume of 12 mL) there is intraventricular extension into the fourth ventricle. There is also mass effect with effacement the basal cisterns. No hydrocephalus at this time. 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 Vascular: See forthcoming CTA. Skull: No acute fracture. Sinuses/Orbits: Paranasal sinus mucosal thickening. ASPECTS [facility] Stroke Program Early CT Score) Total score (0-10 with 10 being normal): 10. IMPRESSION: 1. Acute 3.5 cm intraparenchymal hemorrhage in the pons with intraventricular extension into the fourth ventricle and effacement of the basal cisterns. No hydrocephalus at this time. 2. 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 An MRI could provide further evaluation clinically warranted.  Findings discussed with Dr. Annabell Key via telephone at 8:10 PM. Electronically Signed   By: Stevenson Elbe M.D.   On: 12/26/2023 20:10    Procedures .Critical Care  Performed by: Early Glisson, MD Authorized by: Early Glisson, MD   Critical care provider statement:    Critical care time (minutes):  75   Critical care time was exclusive of:  Separately billable procedures and treating other patients and teaching time   Critical care was necessary to treat or prevent imminent or life-threatening deterioration of the following conditions:  Respiratory failure and CNS failure or compromise   Critical care was time spent personally by me on the following activities:  Development of treatment plan with patient or surrogate, discussions with consultants, evaluation of patient's response to treatment, examination of patient, obtaining history from patient or surrogate, review of old charts, re-evaluation of patient's condition, pulse oximetry, ordering and review of radiographic studies, ordering and review of laboratory studies and ordering and performing treatments and interventions   I assumed direction of critical care for this patient from another provider in my specialty: no     Care discussed with: admitting provider and accepting provider at another facility   Comments:       Procedure Name: Intubation Date/Time: 12/26/2023 7:59 PM  Performed by: Early Glisson, MDPre-anesthesia Checklist: Emergency Drugs available, Patient identified, Patient being monitored, Timeout performed and Suction available Oxygen Delivery Method: Ambu bag Preoxygenation: Pre-oxygenation with 100% oxygen Laryngoscope Size: Mac and 4 Tube size: 7.5 mm Number of attempts: 1 Airway Equipment and Method: Stylet Placement Confirmation: ETT inserted through vocal cords under direct vision, Positive ETCO2, CO2 detector and Breath sounds checked- equal and bilateral Secured at: 25 cm Tube secured with: ETT holder Dental  Injury: Teeth and Oropharynx as per pre-operative assessment  Difficulty Due To: Difficulty was unanticipated Comments:      Central Line  Date/Time: 12/26/2023 8:00 PM  Performed by: Early Glisson, MD Authorized by: Early Glisson, MD  Consent:    Consent obtained:  Emergent situation Universal protocol:    Immediately prior to procedure, a time out was called: yes     Patient identity confirmed:  Arm band Pre-procedure details:    Indication(s): central venous access     Hand hygiene: Hand hygiene performed prior to insertion     Sterile barrier technique: All elements of maximal sterile technique followed     Skin preparation:  Chlorhexidine    Skin preparation agent: Skin preparation agent completely dried prior to procedure   Sedation:    Sedation type:  None Anesthesia:    Anesthesia method:  None Procedure details:    Location:  R femoral   Patient position:  Supine   Procedural supplies:  Triple lumen   Catheter size:  7 Fr   Landmarks identified: yes     Ultrasound guidance: no     Number of attempts:  1   Successful placement: yes   Post-procedure details:    Post-procedure:  Dressing applied and line sutured   Assessment:  Blood return through all ports and free fluid flow   Procedure completion:  Tolerated well, no immediate complications Comments:           Medications Ordered in ED Medications  EPINEPHrine  (ADRENALIN ) 5 mg in NS 250 mL (0.02 mg/mL) premix infusion (0 mcg/min Intravenous Not Given 12/26/23 2004)  nitroGLYCERIN  0.2 mg/mL in dextrose  5 % infusion (has no administration in time range)  clevidipine  (CLEVIPREX ) infusion 0.5 mg/mL (has no administration in time range)  norepinephrine  (LEVOPHED ) 4mg  in (0.016 mg/mL) premix infusion (2 mcg/min Intravenous New Bag/Given 12/26/23 2010)  propofol  (DIPRIVAN ) 1000 MG/100ML infusion (5 mcg/kg/min  70 kg Intravenous New Bag/Given 12/26/23 2009)  levETIRAcetam  (KEPPRA ) IVPB 1000 mg/100 mL premix  (has no administration in time range)  levETIRAcetam  (KEPPRA ) 1000 MG/100ML IVPB (has no administration in time range)    ED Course/ Medical Decision Making/ A&P Clinical Course as of 12/26/23 2022  Sun Dec 26, 2023  2010 Case with radiology who has identified the intracranial hemorrhage in the pontine area with extension into the ventricles. [BM]    Clinical Course User Index [BM] Early Glisson, MD                                 Medical Decision Making Amount and/or Complexity of Data Reviewed Labs: ordered. Radiology: ordered.  Risk Prescription drug management. Decision regarding hospitalization.    This patient presents to the ED for concern of acute change in mental status.  This patient is critically ill and is totally unresponsive, would consider multiple different etiologies including intracranial hemorrhage, stroke, myocardial infarction, overdose, hypoglycemia, this involves an extensive number of treatment options, and is a complaint that carries with it a high risk of complications and morbidity.  The differential diagnosis includes multiple severe pathologic illnesses   Co morbidities that complicate the patient evaluation  Severe untreated hypertension   Additional history obtained:  Additional history obtained from spouse, also medical record, the patient had been operated on for a cataract about 10 days ago, seen and admitted in the past for hypertension and hyperglycemia External records from outside source obtained and reviewed including as above   Lab Tests:  I Ordered, and personally interpreted labs.  The pertinent results include: ABG or VBG shows acidosis, glucose 176, mild hyponatremia, normal renal function, lactate of 3.3, blood counts unremarkable, troponin elevated at 105, alcohol   detectable at 16   Imaging Studies ordered:  I ordered imaging studies including CT scan of the brain without contrast I independently visualized and interpreted  imaging which showed pontine hemorrhage with intraparenchymal hemorrhage about 3.5 cm with intraventricular extension into the fourth ventricle.  No hydrocephalus at this time I agree with the radiologist interpretation   Cardiac Monitoring: / EKG:  The patient was maintained on a cardiac monitor.  I personally viewed and interpreted the cardiac monitored which showed an underlying rhythm of: Sinus tachycardia   Problem List / ED Course / Critical interventions / Medication management  The patient is obviously critically ill, I discussed his care with both the telecommunication specialist with neurology as well as with our in-house neurologist Dr. Renaee Caro and I appreciate his expertise and willingness to care for this patient.  The patient will need to be admitted to the intensive care unit, the blood pressure will be kept lower than 140/90, unfortunately he has a grave prognosis given the location of this bleed, the family will be updated, neurosurgery will be consulted Hypertonic saline added at 50 cc/kg at the advisement of the neurohospitalist Dr. Renaee Caro I discussed the care with Dr. Ellery Guthrie with neurosurgery who does not have any acute recommendations for interventions at this time, agreeable to see the patient on arrival as needed by neurohospitalist. ICU bed requested I ordered medication including Keppra  for prevention Reevaluation of the patient after these medicines showed that the patient critically ill I have reviewed the patients home medicines and have made adjustments as needed   Social Determinants of Health:  Untreated hypertension   Test / Admission - Considered:  Admit to ICU  Prior to transfer I did update the family about the patient's poor prognosis, they are hopeful and want him to be a full code for now.          Final Clinical Impression(s) / ED Diagnoses Final diagnoses:  Intracranial hemorrhage (HCC)  Uncontrolled hypertension    Rx / DC  Orders ED Discharge Orders     None         Early Glisson, MD 12/26/23 2128

## 2023-12-26 NOTE — Progress Notes (Signed)
 eLink Physician-Brief Progress Note Patient Name: Dylan Roberts DOB: 1966-07-27 MRN: [id number]   Date of Service  12/26/2023  HPI/Events of Note  58 year old male with hypertension that initially presented with acute encephalopathy and vision loss with asymmetric pupils he was admitted to the ICU after being found to have 3rd and 4th ventricle intraventricular hemorrhage with midbrain ICH and mass effect concerning for recent CVA.  On examination, the patient is severely hypertensive into the 200s, tachycardic, tachypneic and saturating 97% on 40% FiO2 on the ventilator.  Results show adequate ventilation but poor oxygenation.  Had some hyponatremia hypokalemia and some leukocytosis.  Positive ethanol level  eICU Interventions  Neuroprotective measures in place, avoid fevers, 3% saline, head of bed elevation, hyperoxia goal 92%+  ICH score 4 suggesting high risk of mortality, recommend palliative care consultation in the morning  Interval imaging per neurology, neurosurgical intervention pending  Strict SBP goal 130-150, would likely benefit from arterial line.  DVT prophylaxis with SCDs, chemoprophylaxis contraindicated in the setting of ICH GI prophylaxis with PPI     Intervention Category Evaluation Type: New Patient Evaluation  Leshia Kope 12/26/2023, 10:25 PM

## 2023-12-26 NOTE — ED Notes (Signed)
 Xray bedside.

## 2023-12-26 NOTE — ED Notes (Signed)
 Paged neurosurgery at this time. Dylan Roberts

## 2023-12-26 NOTE — ED Triage Notes (Signed)
 Pt was unresponsive when ems arrived. Per family pt did not feel well.

## 2023-12-26 NOTE — H&P (Signed)
 NEUROLOGY H&P NOTE   Date of service: December 26, 2023 Patient Name: Dylan Roberts MRN:  [id number] DOB:  05/19/1966 Chief Complaint: AMS, weakness and vision loss  History of Present Illness  58 year old male with a PMHx of DM, HTN, hypercholesterolemia, cataract surgery about 1.5 week ago and neuropathy who presented to the [facility] ED via EMS with AMS, vision loss and asymmetric pupils. LKN 18:15. He was acting poorly today before this happened. Family said he was altered and he yelled for them and left arm was weak and had vision loss. He developed acute onset of not being able to see and weakness of his left arm, he had one of his grandchildren going get his wife who came into the room, he was not able to use his left arm, stated he felt very poorly and to call 911.  When EMS arrived he was unresponsive and had asymmetric pupils. He was breathing by himself, had good pulses, but was noted to be severely hypertensive well over 200 systolic. Within minutes of being on the ambulance the patient stopped breathing and required bag-valve-mask assisted ventilation for about 13 minutes prehospital, he did not need any CPR as he had pulses the entire way. On arrival the patient's blood pressure was severely elevated, his heart rate was in the 40s and then he had severe hypotension that required a dose of epinephrine . He never lost pulses. The patient is unable to answer any questions and was intubated on arrival for airway protection as he had vomited and probably aspirated and route to the hospital  Got bradycardic into the 30s before intubation. ETOH was 16 and Na 128. 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 ICH appears aneurysmal/vessel related so emergent vessel imaging pending (please f/u radiology read and contact NSG or Neuro IR with results). He was evaluated by Teleneurology who  recommended toxic/metabolic/infectious workup (U/A, COVID, chest xray, and UDS etc as per ED MD/primary team discretion), admission for ICH workup and management, SBP < 140/90, NSG consult, load of Keppra  1000 mg IV and then 500 mg bid. Repeat CT was recommended in 24 hrs unless clinically worsens (repeat stat). Teleneurology also recommended palliative care consult as the location of the ICH is worrisome for severe brain injury and poor prognosis.   ICH score is 4, which is associated with a 97% mortality rate.     ROS  Unable to perform due to unresponsive state.   Past History   Past Medical History:  Diagnosis Date   Cataract    Left Eye   Diabetes mellitus without complication (HCC)    High cholesterol    Hypertension    Neuropathy    bilateral hands and feet   Past Surgical History:  Procedure Laterality Date   CATARACT EXTRACTION W/PHACO Right 11/15/2023   Procedure: PHACOEMULSIFICATION, CATARACT, WITH IOL INSERTION;  Surgeon: Tarri Farm, MD;  Location: [facility];  Service: Ophthalmology;  Laterality: Right;  CDE: 37.81   CATARACT EXTRACTION W/PHACO Left 12/10/2023   Procedure: PHACOEMULSIFICATION, CATARACT, WITH IOL INSERTION;  Surgeon: Tarri Farm, MD;  Location: [facility];  Service: Ophthalmology;  Laterality: Left;  CDE: 35.90   EAR CYST EXCISION Left    Dr. Tina Fordyce   INGUINAL HERNIA REPAIR Right 12/09/2012   Procedure: HERNIA REPAIR INGUINAL ADULT;  Surgeon: Beau Bound, MD;  Location: [facility];  Service: General;  Laterality: Right;  Right Inguinal Herniorraphy   INSERTION OF MESH Right 12/09/2012   Procedure: INSERTION OF MESH;  Surgeon: Beau Bound, MD;  Location: [facility];  Service: General;  Laterality: Right;  Insertion of Mesh Plug Right Inguinal   Family History  Problem Relation Age of Onset   Diabetes Mother    Social History   Socioeconomic History   Marital status: Single    Spouse name: Not on file   Number of children: Not on file   Years of  education: Not on file   Highest education level: Not on file  Occupational History   Not on file  Tobacco Use   Smoking status: Every Day    Current packs/day: 0.25    Average packs/day: 0.3 packs/day for 30.0 years (7.5 ttl pk-yrs)    Types: Cigarettes   Smokeless tobacco: Never  Vaping Use   Vaping status: Never Used  Substance and Sexual Activity   Alcohol  use: Yes    Alcohol /week: 42.0 standard drinks of alcohol     Types: 42 Cans of beer per week    Comment: 6 pack of beer daily   Drug use: No   Sexual activity: Yes    Birth control/protection: None  Other Topics Concern   Not on file  Social History Narrative   Not on file   Social Drivers of Health   Financial Resource Strain: Not on file  Food Insecurity: Not on file  Transportation Needs: Not on file  Physical Activity: Not on file  Stress: Not on file  Social Connections: Not on file   No Known Allergies  Medications   Current Facility-Administered Medications:    Chlorhexidine  Gluconate Cloth 2 % PADS 6 each, 6 each, Topical, Daily, Kimberley Penman, MD, 6 each at 12/26/23 2209   clevidipine  (CLEVIPREX ) infusion 0.5 mg/mL, 0-21 mg/hr, Intravenous, Continuous, Early Glisson, MD, Last Rate: 4 mL/hr at 12/26/23 2224, 2 mg/hr at 12/26/23 2224   EPINEPHrine  (ADRENALIN ) 5 mg in NS 250 mL (0.02 mg/mL) premix infusion, 0.5-20 mcg/min, Intravenous, Titrated, Paliwal, Aditya, MD   norepinephrine  (LEVOPHED ) 4mg  in (0.016 mg/mL) premix infusion, 0-40 mcg/min, Intravenous, Titrated, Paliwal, Aditya, MD, Stopped at 12/26/23 2035   Oral care mouth rinse, 15 mL, Mouth Rinse, Q2H, Kimberley Penman, MD   Oral care mouth rinse, 15 mL, Mouth Rinse, PRN, Lexianna Weinrich, MD   propofol  (DIPRIVAN ) 1000 MG/100ML infusion, 5-80 mcg/kg/min, Intravenous, Continuous, Early Glisson, MD, Last Rate: 21 mL/hr at 12/26/23 2051, 50 mcg/kg/min at 12/26/23 2051   sodium chloride  (hypertonic) 3 % solution, , Intravenous, Continuous, Early Glisson,  MD, Last Rate: 50 mL/hr at 12/26/23 2044, New Bag at 12/26/23 2044   Vitals   Vitals:   12/26/23 2216 12/26/23 2220 12/26/23 2223 12/26/23 2225  BP: (!) 224/109  (!) 218/107 139/75  Pulse: (!) 110  (!) 113 (!) 112  Resp: (!) 28  (!) 23 20  Temp: 97.7 F (36.5 C)  97.7 F (36.5 C) 97.7 F (36.5 C)  TempSrc: Bladder     SpO2: 99%  97% 98%  Weight: 70 kg     Height:  5' 7.99" (1.727 m)       Body mass index is 23.47 kg/m.  Physical Exam   Physical Exam  HEENT-  /AT. No nuchal rigidity Lungs- Intubated  Extremities- No edema  Neurological Examination  While on sedation no spontaneous movements were seen, including no posturing or tremoring. Remainder of exam performed 20 minutes after IV sedation was held.  Mental Status: Unresponsive. No cortically mediated responses to any stimuli. No eye opening spontaneously or to noxious. Does not respond to name or other verbal stimuli.  Cranial Nerves: II: No blink to threat bilaterally.  Left pupil pinpoint, right pupil 2 mm and unreactive  III,IV, VI: Absent oculocephalic reflex. Eyes are midline. No nystagmus. .  V: Absent corneal reflexes  VII: Flaccidly symmetric VIII: No response to voice IX,X: Intubated XI: Head is midline XII: Unable to assess Motor: BUE without volitional movement to sternal rub and no withdrawal to noxious.  BLE with triple flexion responses to noxious only.   Mildly increased flexor tone of BUE, mildly increased extensor tone of BLE and intermittent low amplitude tremoring of BUE noted.  Sensory: Triple flexion responses of BLE to noxious plantar stimulation. No responses to pinch of upper extremities.   Deep Tendon Reflexes: 4+ bilateral patellae and brachioradialis reflexes. Toes upgoing bilaterally.  Cerebellar/Gait: Unable to assess  Other: There is myoclonus involving the left neck intermittently, as well as diaphragmatic myoclonus.   . Absent corneal and oculocephalic reflexes.   Labs   CBC:   Recent Labs  Lab 12/26/23 1927  WBC 11.1*  NEUTROABS 5.8  HGB 13.1  HCT 39.2  MCV 91.0  PLT 295   Basic Metabolic Panel:  Lab Results  Component Value Date   NA 128 (L) 12/26/2023   K 3.6 12/26/2023   CO2 26 12/26/2023   GLUCOSE 129 (H) 12/26/2023   BUN 17 12/26/2023   CREATININE 0.54 (L) 12/26/2023   CALCIUM 8.4 (L) 12/26/2023   GFRNONAA >60 12/26/2023   GFRAA >60 08/28/2016   Lipid Panel: No results found for: "LDLCALC" HgbA1c:  Lab Results  Component Value Date   HGBA1C 10.1 (H) 11/28/2013   Urine Drug Screen:     Component Value Date/Time   LABOPIA NONE DETECTED 12/26/2023 1954   COCAINSCRNUR NONE DETECTED 12/26/2023 1954   LABBENZ NONE DETECTED 12/26/2023 1954   AMPHETMU NONE DETECTED 12/26/2023 1954   THCU NONE DETECTED 12/26/2023 1954   LABBARB NONE DETECTED 12/26/2023 1954    Alcohol  Level     Component Value Date/Time   ETH 16 (H) 12/26/2023 1927   INR  Lab Results  Component Value Date   INR 0.9 12/26/2023   APTT  Lab Results  Component Value Date   APTT 25 12/26/2023    CTA of head from 2023: 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 Otherwise normal intracranial vasculature.  Assessment  JERMAR COLTER is a 58 y.o. male with a PMHx of DM, HTN, hypercholesterolemia, cataract surgery about 1.5 week ago and neuropathy who presented to the [facility] ED via EMS with AMS, vision loss and asymmetric pupils. LKN 18:15. 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 ICH appears aneurysmal/vessel related so emergent vessel imaging pending (please f/u radiology read and contact NSG or Neuro IR with results). He was evaluated by Teleneurology who recommended toxic/metabolic/infectious workup (U/A, COVID, chest xray, and UDS etc as per ED MD/primary team discretion), admission for ICH workup and management, SBP < 140/90, NSG consult,  load of Keppra  1000 mg IV and then 500 mg bid. Repeat CT was recommended in 24 hrs unless clinically worsens (repeat stat). Teleneurology also recommended palliative care consult as the location of the ICH is worrisome for severe brain injury and poor prognosis.  - Exam off sedation reveals  myoclonus involving the left neck intermittently, as well as diaphragmatic myoclonus. No responses to any external stimuli. Mildly increased flexor tone of BUE, mildly increased extensor tone of BLE and intermittent low amplitude tremoring of BUE noted. Left pupil pinpoint, right pupil 2 mm and unreactive. Absent corneal and oculocephalic reflexes.  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 Otherwise normal intracranial vasculature. - ETOH was 16 and Na 128.  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 Etiology is most likely hypertensive. Prognosis is poor with ICH score of 4 corresponding to a mortality rate of 97%.  Plan  - Admit to ICU under Neurology service - CCM has been consulted for ventilator management as well as assistance for management of electrolytes.  - Neurosurgery has been consulted.  - Hypertonic saline at 50 cc/hr with q6h sodium checks. Benefits for prevention of edema that could result in catastrophic hypoperfusion of his pontine tegmentum, anterior to his ICH, outweigh the risk and associated potential morbidity/mortality of CPM.  - Repeat CT head at 2 AM (6 hours after initial CT) has been ordered.  - MRI brain - TTE - PT consult, OT consult, Speech consult - Cardiac telemetry - Frequent neuro checks - BP management with clevidipine  drip  - No antiplatelet medications or anticoagulants - DVT prophylaxis with SCDs - Will need Palliative consult. Await arrival of family to review CT with them in person and discuss with them his prognosis  Addendum: - Repeat CT head at 0215:  1. Stable  intraparenchymal hemorrhage in the pons with intraventricular extension. 2. Interval development of mild hydrocephalus. 3. 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 - I have contacted Neurosurgery (Dr. Ellery Guthrie) to obtain their opinion on whether or not the patient is a candidate for a ventricular drain. Their review of his CT scan is pending.   60 minutes spent in the emergent neurological evaluation and management of this critically ill patient.  ______________________________________________________________________   Hope Ly, Kyce Ging, MD [facility]

## 2023-12-26 NOTE — Consult Note (Addendum)
 CTA was cancelled by primary team  [facility] TeleNeurology Consult Services   Patient Name:   Dylan Roberts, Dylan Roberts Date of Birth:   01-Nov-1965 Identification Number:   MRN - [id number] Date of Service:   12/26/2023 19:56:55  Diagnosis:       I63.89 - Cerebrovascular accident (CVA) due to other mechanism (HCCC)       I61.3 - Intracerebral hemorrhage in brain stem       I61.5 - Intracerebral hemorrhage, intraventricular  Impression:      89 Roberts presented via EMS with AMS and vision loss and asymmetric pupils. HCT shows 3rd and 4th IVH and pons, midbrain ICH with mass effect and subacute edema of the right occipital lobe concerning for subacute CVA. ICH appears aneurysmal/vessel related so emergent vessel imaging pending (please f/u radiology read and contact NSG or Neuro IR with results). Not a thrombolytic candidate 2/2 ICH. Rec toxic metabolic infectious workup (U/A, COVID, chest xray, and UDS etc as per ED MD/primary team discretion), rec admission for ICH workup. SBP < 140/90, NSG consult, load of Keppra  1000 mg IV and then 500 mg bid. Repeat HCT in 24 hrs unless clinically worsens (repeat stat). Rec palliative care follow along as location worrisome for severe brain injury and poor prognosis  Our recommendations are outlined below.  Recommendations:        Stroke/Telemetry Floor       Neuro Checks       Bedside Swallow Eval       DVT Prophylaxis       IV Fluids, Normal Saline       Head of Bed 30 Degrees       Euglycemia and Avoid Hyperthermia (PRN Acetaminophen )    ------------------------------------------------------------------------------  Advanced Imaging: Advanced imaging has been ordered. Results pending.   Metrics: Last Known Well: 12/26/2023 18:15:00 Dispatch Time: 12/26/2023 19:56:55 Arrival Time: 12/26/2023 19:25:00 Initial Response Time: 12/26/2023 20:01:04 Symptoms: AMS and vision loss. Initial patient interaction: 12/26/2023 20:04:58 NIHSS  Assessment Completed: 12/26/2023 40:98:11 Patient is not a candidate for Thrombolytic. Thrombolytic Medical Decision: 12/26/2023 91:47:82 Patient was not deemed candidate for Thrombolytic because of following reasons: Current intracranial hemorrhage .  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  Patient was brought by EMS for symptoms of AMS and vision loss. Dylan Roberts presented via EMS with AMS and vision loss and asymmetric pupils. LKN 18:15. He was acting poorly today before this happened. Family said he was altered and he yelled for them and left arm was weak and had vision loss and when EMS arrived he was unresponsive and was intubated in the ED and had asymmetric pupils.  Has HTN and not following with MD. Got brady into 30s before intubation.  Cataract surgery about 1.5 week ago.  ETOH 16  Na 128   Past Medical History:      Hypertension      Diabetes Mellitus      There is no history of Stroke  Medications:  No Anticoagulant use  No Antiplatelet use Reviewed EMR for current medications  Allergies:  Reviewed  Social History: Smoking: Yes Alcohol  Use: Yes Drug Use: No  Family History:  There is no family history  of premature cerebrovascular disease pertinent to this consultation  ROS : 14 Points Review of Systems was performed and was negative except mentioned in HPI.  Past Surgical History: There Is No Surgical History Contributory To Today's Visit    Examination: BP(103/77), Pulse(113), Blood Glucose(129) 1A: Level of Consciousness - Postures or Unresponsive + 3 1B: Ask Month and  Age - Could Not Answer Either Question Correctly + 2 1C: Blink Eyes & Squeeze Hands - Performs 0 Tasks + 2 2: Test Horizontal Extraocular Movements - Normal + 0 3: Test Visual Fields - No Visual Loss + 0 4: Test Facial Palsy (Use Grimace if Obtunded) - Normal symmetry + 0 5A: Test Left Arm Motor Drift - No Movement + 4 5B: Test Right Arm Motor Drift - No Movement + 4 6A: Test Left Leg Motor Drift - No Movement + 4 6B: Test Right Leg Motor Drift - No Movement + 4 7: Test Limb Ataxia (FNF/Heel-Shin) - Does Not Understand + 0 8: Test Sensation - Coma/Unresponsive + 2 9: Test Language/Aphasia - Coma/Unresponsive + 3 10: Test Dysarthria - Intubated/Unable to Test + 0 11: Test Extinction/Inattention - No abnormality + 0  NIHSS Score: Dylan  NIHSS Free Text : 3 mm pupils and symmetric left is surgical , no corneal reflex. Not breathing over vent settings.  Pre-Morbid Modified Rankin Scale: 0 Points = No symptoms at all  Spoke with : Dr. Annabell Key  This consult was conducted in real time using interactive audio and Immunologist. Patient was informed of the technology being used for this visit and agreed to proceed. Patient located in hospital and provider located at home/office setting.   Patient is being evaluated for possible acute neurologic impairment and high probability of imminent or life-threatening deterioration. I spent total of 26 minutes providing care to this patient, including time for face to face visit via telemedicine, review of medical records, imaging studies and discussion of findings with providers, the patient and/or family.   Dr Ada Acres   [facility] For Inpatient follow-up with [facility] physician please call [facility] at [phone]. As we are not an outpatient service for any post hospital discharge needs please contact the hospital for assistance. If you have any questions for the [facility] physicians or need to reconsult for clinical or  diagnostic changes please contact us  via [facility] at [phone].

## 2023-12-26 NOTE — Progress Notes (Signed)
 1949 - Code Stroke Activated   1949 - Patient to CT   LKWT 1815. mRS 0. Presents to the ED by EMS after family days patient was altered, with LUE flaccid, vision loss, and when EMS arrived at patient he was unresponsive GCS 3. Patient intubated on arrival to ED and has asymmetrical pupils  1954 - Dr. Murvin Arthurs paged  1955 - Dr. Murvin Arthurs called to ask us  to utilize the Tele-Neurologist  1956 - Tele-Neurologist paged   2000 - Patient returned to room from CT   2001 - Dr. Jenness Mock joined stroke cart   2010 - NCCT results relayed to Dr. Jenness Mock

## 2023-12-26 NOTE — ED Notes (Signed)
 [facility] already called for transport. Dylan Roberts

## 2023-12-26 NOTE — ED Notes (Signed)
 Patient transported to CT

## 2023-12-27 ENCOUNTER — Inpatient Hospital Stay (HOSPITAL_COMMUNITY)

## 2023-12-27 DIAGNOSIS — I6389 Other cerebral infarction: Secondary | ICD-10-CM | POA: Diagnosis not present

## 2023-12-27 DIAGNOSIS — I610 Nontraumatic intracerebral hemorrhage in hemisphere, subcortical: Secondary | ICD-10-CM | POA: Diagnosis not present

## 2023-12-27 DIAGNOSIS — I613 Nontraumatic intracerebral hemorrhage in brain stem: Principal | ICD-10-CM

## 2023-12-27 DIAGNOSIS — G935 Compression of brain: Secondary | ICD-10-CM | POA: Diagnosis not present

## 2023-12-27 DIAGNOSIS — J9601 Acute respiratory failure with hypoxia: Secondary | ICD-10-CM

## 2023-12-27 LAB — POCT I-STAT 7, (LYTES, BLD GAS, ICA,H+H)
Acid-base deficit: 5 mmol/L — ABNORMAL HIGH (ref 0.0–2.0)
Bicarbonate: 18.8 mmol/L — ABNORMAL LOW (ref 20.0–28.0)
Calcium, Ion: 1.32 mmol/L (ref 1.15–1.40)
HCT: 32 % — ABNORMAL LOW (ref 39.0–52.0)
Hemoglobin: 10.9 g/dL — ABNORMAL LOW (ref 13.0–17.0)
O2 Saturation: 100 %
Patient temperature: 37.6
Potassium: 4 mmol/L (ref 3.5–5.1)
Sodium: 140 mmol/L (ref 135–145)
TCO2: 20 mmol/L — ABNORMAL LOW (ref 22–32)
pCO2 arterial: 31 mmHg — ABNORMAL LOW (ref 32–48)
pH, Arterial: 7.393 (ref 7.35–7.45)
pO2, Arterial: 182 mmHg — ABNORMAL HIGH (ref 83–108)

## 2023-12-27 LAB — CBC
HCT: 39.1 % (ref 39.0–52.0)
Hemoglobin: 13.6 g/dL (ref 13.0–17.0)
MCH: 30.6 pg (ref 26.0–34.0)
MCHC: 34.8 g/dL (ref 30.0–36.0)
MCV: 88.1 fL (ref 80.0–100.0)
Platelets: 316 10*3/uL (ref 150–400)
RBC: 4.44 MIL/uL (ref 4.22–5.81)
RDW: 12.9 % (ref 11.5–15.5)
WBC: 16.5 10*3/uL — ABNORMAL HIGH (ref 4.0–10.5)
nRBC: 0 % (ref 0.0–0.2)

## 2023-12-27 LAB — GLUCOSE, CAPILLARY
Glucose-Capillary: 150 mg/dL — ABNORMAL HIGH (ref 70–99)
Glucose-Capillary: 158 mg/dL — ABNORMAL HIGH (ref 70–99)
Glucose-Capillary: 208 mg/dL — ABNORMAL HIGH (ref 70–99)
Glucose-Capillary: 263 mg/dL — ABNORMAL HIGH (ref 70–99)
Glucose-Capillary: 285 mg/dL — ABNORMAL HIGH (ref 70–99)
Glucose-Capillary: 288 mg/dL — ABNORMAL HIGH (ref 70–99)
Glucose-Capillary: 81 mg/dL (ref 70–99)

## 2023-12-27 LAB — LIPID PANEL
Cholesterol: 183 mg/dL (ref 0–200)
HDL: 52 mg/dL (ref >40)
LDL Cholesterol: 87 mg/dL (ref 0–99)
Total CHOL/HDL Ratio: 3.5 ratio
Triglycerides: 219 mg/dL — ABNORMAL HIGH (ref <150)
VLDL: 44 mg/dL — ABNORMAL HIGH (ref 0–40)

## 2023-12-27 LAB — ECHOCARDIOGRAM COMPLETE
AR max vel: 2.52 cm2
AV Area VTI: 2.93 cm2
AV Area mean vel: 2.4 cm2
AV Mean grad: 2 mmHg
AV Peak grad: 3.2 mmHg
Ao pk vel: 0.9 m/s
Area-P 1/2: 2.74 cm2
Calc EF: 72 %
Height: 67.992 in
S' Lateral: 3.6 cm
Single Plane A2C EF: 70.4 %
Single Plane A4C EF: 73.7 %
Weight: 2469.15 [oz_av]

## 2023-12-27 LAB — PROTIME-INR
INR: 1 (ref 0.8–1.2)
Prothrombin Time: 13.3 s (ref 11.4–15.2)

## 2023-12-27 LAB — LACTIC ACID, PLASMA: Lactic Acid, Venous: 1.8 mmol/L (ref 0.5–1.9)

## 2023-12-27 LAB — RAPID URINE DRUG SCREEN, HOSP PERFORMED
Amphetamines: NOT DETECTED
Barbiturates: NOT DETECTED
Benzodiazepines: NOT DETECTED
Cocaine: NOT DETECTED
Opiates: NOT DETECTED
Tetrahydrocannabinol: NOT DETECTED

## 2023-12-27 LAB — MRSA NEXT GEN BY PCR, NASAL: MRSA by PCR Next Gen: NOT DETECTED

## 2023-12-27 LAB — SODIUM
Sodium: 135 mmol/L (ref 135–145)
Sodium: 140 mmol/L (ref 135–145)
Sodium: 141 mmol/L (ref 135–145)
Sodium: 141 mmol/L (ref 135–145)

## 2023-12-27 LAB — HEMOGLOBIN A1C
Hgb A1c MFr Bld: 10.9 % — ABNORMAL HIGH (ref 4.8–5.6)
Mean Plasma Glucose: 266.13 mg/dL

## 2023-12-27 LAB — HIV ANTIBODY (ROUTINE TESTING W REFLEX): HIV Screen 4th Generation wRfx: NONREACTIVE

## 2023-12-27 LAB — APTT: aPTT: 25 s (ref 24–36)

## 2023-12-27 MED ORDER — LABETALOL HCL 5 MG/ML IV SOLN
10.0000 mg | INTRAVENOUS | Status: DC | PRN
  Administered 2023-12-27: 10 mg via INTRAVENOUS
  Filled 2023-12-27: qty 4

## 2023-12-27 MED ORDER — PERFLUTREN LIPID MICROSPHERE
1.0000 mL | INTRAVENOUS | Status: AC | PRN
  Administered 2023-12-27: 2 mL via INTRAVENOUS

## 2023-12-27 MED ORDER — POLYETHYLENE GLYCOL 3350 17 G PO PACK: 17.0000 g | PACK | Freq: Every day | ORAL | Status: DC

## 2023-12-27 MED ORDER — HYDRALAZINE HCL 20 MG/ML IJ SOLN
10.0000 mg | INTRAMUSCULAR | Status: DC | PRN
  Administered 2023-12-28: 20 mg via INTRAVENOUS
  Filled 2023-12-27: qty 1

## 2023-12-27 MED ORDER — LABETALOL HCL 5 MG/ML IV SOLN
10.0000 mg | INTRAVENOUS | Status: DC | PRN
  Administered 2023-12-28: 20 mg via INTRAVENOUS
  Filled 2023-12-27: qty 4

## 2023-12-27 MED ORDER — FAMOTIDINE 20 MG PO TABS: 20.0000 mg | ORAL_TABLET | Freq: Two times a day (BID) | ORAL | Status: DC

## 2023-12-27 MED ORDER — DOCUSATE SODIUM 50 MG/5ML PO LIQD
100.0000 mg | Freq: Two times a day (BID) | ORAL | Status: DC
  Administered 2023-12-27: 100 mg
  Filled 2023-12-27: qty 10

## 2023-12-27 MED ORDER — FENTANYL CITRATE PF 50 MCG/ML IJ SOSY
50.0000 ug | PREFILLED_SYRINGE | INTRAMUSCULAR | Status: DC | PRN
  Administered 2023-12-27 (×2): 50 ug via INTRAVENOUS
  Administered 2023-12-27 (×2): 100 ug via INTRAVENOUS
  Administered 2023-12-27 (×2): 50 ug via INTRAVENOUS
  Administered 2023-12-27 – 2023-12-28 (×2): 100 ug via INTRAVENOUS
  Filled 2023-12-27: qty 1
  Filled 2023-12-27 (×2): qty 2
  Filled 2023-12-27: qty 1
  Filled 2023-12-27 (×2): qty 2
  Filled 2023-12-27: qty 1

## 2023-12-27 MED ORDER — FENTANYL CITRATE PF 50 MCG/ML IJ SOSY
50.0000 ug | PREFILLED_SYRINGE | INTRAMUSCULAR | Status: DC | PRN
  Filled 2023-12-27: qty 1

## 2023-12-27 NOTE — Progress Notes (Signed)
 OT Cancellation Note  Patient Details Name: AVIEN TAHA MRN: [id number] DOB: 1965/11/25   Cancelled Treatment:    Reason Eval/Treat Not Completed: Active bedrest order (Intubated. Will assess when activity orders updated.)  [facility] 12/27/2023, 6:38 AM Milburn Aliment, OT/L   Acute OT Clinical Specialist Acute Rehabilitation Services Pager [phone] Office [phone]

## 2023-12-27 NOTE — Progress Notes (Signed)
 PT Cancellation Note  Patient Details Name: Dylan Roberts MRN: [id number] DOB: 09-09-1965   Cancelled Treatment:    Reason Eval/Treat Not Completed: Active bedrest order;Medical issues which prohibited therapy this morning. Will continue to follow and evaluate as appropriate.   Barnabas Booth, PT, DPT   Acute Rehabilitation Department Office [phone] Secure Chat Communication Preferred   Dylan Roberts 12/27/2023, 8:16 AM

## 2023-12-27 NOTE — Consult Note (Addendum)
 Reason for Consult:Pontine hemorrhage and hydrocephalus Referring Physician: [name] is an 58 y.o. male.  HPI: Pt is a 58 yo male with sudden onset of headache followed by rapid deterioration of level of consciousness ultimately requiring intubation. Per discussion with ER physician patient did not require any medical sedation for intubation and had no gag. His pupils became dilated. 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    Past Medical History:  Diagnosis Date   Cataract    Left Eye   Diabetes mellitus without complication (HCC)    High cholesterol    Hypertension    Neuropathy    bilateral hands and feet    Past Surgical History:  Procedure Laterality Date   CATARACT EXTRACTION W/PHACO Right 11/15/2023   Procedure: PHACOEMULSIFICATION, CATARACT, WITH IOL INSERTION;  Surgeon: Tarri Farm, MD;  Location: [facility];  Service: Ophthalmology;  Laterality: Right;  CDE: 37.81   CATARACT EXTRACTION W/PHACO Left 12/10/2023   Procedure: PHACOEMULSIFICATION, CATARACT, WITH IOL INSERTION;  Surgeon: Tarri Farm, MD;  Location: [facility];  Service: Ophthalmology;  Laterality: Left;  CDE: 35.90   EAR CYST EXCISION Left    Dr. Tina Fordyce   INGUINAL HERNIA REPAIR Right 12/09/2012   Procedure: HERNIA REPAIR INGUINAL ADULT;  Surgeon: Beau Bound, MD;  Location: [facility];  Service: General;  Laterality: Right;  Right Inguinal Herniorraphy   INSERTION OF MESH Right 12/09/2012   Procedure: INSERTION OF MESH;  Surgeon: Beau Bound, MD;  Location: [facility];  Service: General;  Laterality: Right;  Insertion of Mesh Plug Right Inguinal    Family History  Problem Relation Age of Onset   Diabetes Mother     Social History:  reports that he has been smoking cigarettes. He has a 7.5 pack-year smoking history. He has never  used smokeless tobacco. He reports current alcohol  use of about 42.0 standard drinks of alcohol  per week. He reports that he does not use drugs.  Allergies: No Known Allergies  Medications: I have not reviewed patients meds.  Results for orders placed or performed during the hospital encounter of 12/26/23 (from the past 48 hours)  CBG monitoring, ED     Status: Abnormal   Collection Time: 12/26/23  7:27 PM  Result Value Ref Range   Glucose-Capillary 176 (H) 70 - 99 mg/dL    Comment: Glucose reference range applies only to samples taken after fasting for at least 8 hours.  Comprehensive metabolic panel     Status: Abnormal   Collection Time: 12/26/23  7:27 PM  Result Value Ref Range   Sodium 128 (L) 135 - 145 mmol/L   Potassium 3.6 3.5 - 5.1 mmol/L   Chloride 96 (L) 98 - 111 mmol/L   CO2 26 22 - 32 mmol/L   Glucose, Bld 129 (H) 70 - 99 mg/dL    Comment: Glucose reference range applies only to samples taken after fasting for at least 8 hours.   BUN 17 6 - 20 mg/dL   Creatinine, Ser 1.61 (L) 0.61 - 1.24 mg/dL   Calcium 8.4 (L) 8.9 - 10.3 mg/dL   Total Protein 6.0 (L) 6.5 - 8.1 g/dL   Albumin 2.1 (L) 3.5 - 5.0 g/dL   AST 37 15 - 41 U/L   ALT 42 0 - 44 U/L   Alkaline Phosphatase 171 (H) 38 - 126 U/L  Total Bilirubin 0.8 0.0 - 1.2 mg/dL   GFR, Estimated >14 >78 mL/min    Comment: (NOTE) Calculated using the CKD-EPI Creatinine Equation (2021)    Anion gap 6 5 - 15    Comment: Performed at [facility], [address] [place], [place] [address]  Lactic acid, plasma     Status: Abnormal   Collection Time: 12/26/23  7:27 PM  Result Value Ref Range   Lactic Acid, Venous 3.3 (HH) 0.5 - 1.9 mmol/L    Comment: CRITICAL RESULT CALLED TO, READ BACK BY AND VERIFIED WITH BELTON,K ON 12/26/23 AT 1958 BY PURDIE,J Performed at [facility], [address], [place], [place] [address]   CBC with Differential     Status: Abnormal   Collection Time: 12/26/23  7:27 PM  Result Value Ref Range   WBC  11.1 (H) 4.0 - 10.5 K/uL   RBC 4.31 4.22 - 5.81 MIL/uL   Hemoglobin 13.1 13.0 - 17.0 g/dL   HCT 57.8 46.9 - 62.9 %   MCV 91.0 80.0 - 100.0 fL   MCH 30.4 26.0 - 34.0 pg   MCHC 33.4 30.0 - 36.0 g/dL   RDW 52.8 41.3 - 24.4 %   Platelets 295 150 - 400 K/uL   nRBC 0.0 0.0 - 0.2 %   Neutrophils Relative % 51 %   Neutro Abs 5.8 1.7 - 7.7 K/uL   Lymphocytes Relative 40 %   Lymphs Abs 4.4 (H) 0.7 - 4.0 K/uL   Monocytes Relative 5 %   Monocytes Absolute 0.5 0.1 - 1.0 K/uL   Eosinophils Relative 2 %   Eosinophils Absolute 0.2 0.0 - 0.5 K/uL   Basophils Relative 1 %   Basophils Absolute 0.1 0.0 - 0.1 K/uL   Immature Granulocytes 1 %   Abs Immature Granulocytes 0.08 (H) 0.00 - 0.07 K/uL    Comment: Performed at [facility], [address] [place], [place] [address]  Protime-INR     Status: None   Collection Time: 12/26/23  7:27 PM  Result Value Ref Range   Prothrombin Time 12.7 11.4 - 15.2 seconds   INR 0.9 0.8 - 1.2    Comment: (NOTE) INR goal varies based on device and disease states. Performed at [facility], [address] [place], [place] [address]   Troponin I (High Sensitivity)     Status: Abnormal   Collection Time: 12/26/23  7:27 PM  Result Value Ref Range   Troponin I (High Sensitivity) 105 (HH) <18 ng/L    Comment: CRITICAL RESULT CALLED TO, READ BACK BY AND VERIFIED WITH BELTON,K ON 12/26/23 AT 2007 BY PURDIE,J (NOTE) Elevated high sensitivity troponin I (hsTnI) values and significant  changes across serial measurements may suggest ACS but many other  chronic and acute conditions are known to elevate hsTnI results.  Refer to the "Links" section for chest pain algorithms and additional  guidance. Performed at [facility], [address], [place], [place] [address]   Ethanol     Status: Abnormal   Collection Time: 12/26/23  7:27 PM  Result Value Ref Range   Alcohol , Ethyl (B) 16 (H) <10 mg/dL    Comment: (NOTE) For medical purposes only. Performed at [facility], [address], [place], [place] [address]   APTT     Status: None   Collection Time: 12/26/23  7:27 PM  Result Value Ref Range   aPTT 25 24 - 36 seconds    Comment: Performed at [facility], [address]  [place], [place] [address]  Culture, blood (Routine x 2)     Status: None (Preliminary result)   Collection Time: 12/26/23  7:40 PM   Specimen: Site Not Specified; Blood  Result Value Ref Range   Specimen Description SITE NOT SPECIFIED    Special Requests      BOTTLES DRAWN AEROBIC AND ANAEROBIC Blood Culture adequate volume Performed at [facility], [address] [place], [place] [address]    Culture PENDING    Report Status PENDING   Blood gas, venous (at [facility] and [facility])     Status: Abnormal   Collection Time: 12/26/23  7:41 PM  Result Value Ref Range   pH, Ven 7.11 (LL) 7.25 - 7.43    Comment: CRITICAL RESULT CALLED TO, READ BACK BY AND VERIFIED WITH: BELTON,K ON 12/26/23 AT 1958 BY PURDIE,J    pCO2, Ven 57 44 - 60 mmHg   pO2, Ven 49 (H) 32 - 45 mmHg   Bicarbonate 18.4 (L) 20.0 - 28.0 mmol/L   Acid-base deficit 11.6 (H) 0.0 - 2.0 mmol/L   O2 Saturation 72.5 %   Patient temperature 36.6    Collection site VEIN    Drawn by [phone]     Comment: Performed at [facility], [address] [place], [place] [address]  Culture, blood (Routine x 2)     Status: None (Preliminary result)   Collection Time: 12/26/23  7:42 PM   Specimen: Site Not Specified; Blood  Result Value Ref Range   Specimen Description SITE NOT SPECIFIED    Special Requests      BOTTLES DRAWN AEROBIC ONLY Blood Culture adequate volume Performed at [facility], [address] [place], [place] [address]    Culture PENDING    Report Status PENDING   Urine rapid drug screen (hosp performed)     Status: None   Collection Time: 12/26/23  7:54 PM  Result Value Ref Range   Opiates NONE DETECTED NONE DETECTED   Cocaine NONE DETECTED NONE DETECTED   Benzodiazepines NONE DETECTED NONE DETECTED   Amphetamines NONE  DETECTED NONE DETECTED   Tetrahydrocannabinol NONE DETECTED NONE DETECTED   Barbiturates NONE DETECTED NONE DETECTED    Comment: (NOTE) DRUG SCREEN FOR MEDICAL PURPOSES ONLY.  IF CONFIRMATION IS NEEDED FOR ANY PURPOSE, NOTIFY LAB WITHIN 5 DAYS.  LOWEST DETECTABLE LIMITS FOR URINE DRUG SCREEN Drug Class                     Cutoff (ng/mL) Amphetamine and metabolites    1000 Barbiturate and metabolites    200 Benzodiazepine                 200 Opiates and metabolites        300 Cocaine and metabolites        300 THC                            50 Performed at [facility], [address], [place], [place] [address]   MRSA Next Gen by PCR, Nasal     Status: None   Collection Time: 12/26/23 10:09 PM   Specimen: Nasal Mucosa; Nasal Swab  Result Value Ref Range   MRSA by PCR Next Gen NOT DETECTED NOT DETECTED    Comment: (NOTE) The GeneXpert MRSA Assay (FDA approved for NASAL specimens only), is one component of a comprehensive MRSA colonization surveillance program. It is not intended to diagnose MRSA infection nor  to guide or monitor treatment for MRSA infections. Test performance is not FDA approved in patients less than 48 years old. Performed at [facility], [address], [place], [place] [address]   I-STAT 7, (LYTES, BLD GAS, ICA, H+H)     Status: Abnormal   Collection Time: 12/26/23 10:52 PM  Result Value Ref Range   pH, Arterial 7.317 (L) 7.35 - 7.45   pCO2 arterial 33.9 32 - 48 mmHg   pO2, Arterial 82 (L) 83 - 108 mmHg   Bicarbonate 17.5 (L) 20.0 - 28.0 mmol/L   TCO2 19 (L) 22 - 32 mmol/L   O2 Saturation 96 %   Acid-base deficit 8.0 (H) 0.0 - 2.0 mmol/L   Sodium 132 (L) 135 - 145 mmol/L   Potassium 4.4 3.5 - 5.1 mmol/L   Calcium, Ion 1.34 1.15 - 1.40 mmol/L   HCT 39.0 39.0 - 52.0 %   Hemoglobin 13.3 13.0 - 17.0 g/dL   Patient temperature 63.8 C    Sample type ARTERIAL   Glucose, capillary     Status: Abnormal   Collection Time: 12/26/23 11:20 PM  Result  Value Ref Range   Glucose-Capillary 285 (H) 70 - 99 mg/dL    Comment: Glucose reference range applies only to samples taken after fasting for at least 8 hours.  Sodium     Status: None   Collection Time: 12/27/23 12:26 AM  Result Value Ref Range   Sodium 135 135 - 145 mmol/L    Comment: DELTA CHECK NOTED Performed at [facility], [address] [place], [place] [address]   HIV Antibody (routine testing w rflx)     Status: None   Collection Time: 12/27/23 12:26 AM  Result Value Ref Range   HIV Screen 4th Generation wRfx Non Reactive Non Reactive    Comment: Performed at [facility], [address], [place], [place] [address]  CBC     Status: Abnormal   Collection Time: 12/27/23 12:26 AM  Result Value Ref Range   WBC 16.5 (H) 4.0 - 10.5 K/uL   RBC 4.44 4.22 - 5.81 MIL/uL   Hemoglobin 13.6 13.0 - 17.0 g/dL   HCT 88.4 16.6 - 06.3 %   MCV 88.1 80.0 - 100.0 fL   MCH 30.6 26.0 - 34.0 pg   MCHC 34.8 30.0 - 36.0 g/dL   RDW 01.6 01.0 - 93.2 %   Platelets 316 150 - 400 K/uL   nRBC 0.0 0.0 - 0.2 %    Comment: Performed at [facility], [address] [place], [place] [address]  Protime-INR     Status: None   Collection Time: 12/27/23 12:26 AM  Result Value Ref Range   Prothrombin Time 13.3 11.4 - 15.2 seconds   INR 1.0 0.8 - 1.2    Comment: (NOTE) INR goal varies based on device and disease states. Performed at [facility], [address] [place], [place] [address]   APTT     Status: None   Collection Time: 12/27/23 12:26 AM  Result Value Ref Range   aPTT 25 24 - 36 seconds    Comment: Performed at [facility], [address], [place], [place] [address]  Lipid panel     Status: Abnormal   Collection Time: 12/27/23 12:26 AM  Result Value Ref Range   Cholesterol 183 0 - 200 mg/dL   Triglycerides 237 (H) <150 mg/dL   HDL 52 >62 mg/dL  Total CHOL/HDL Ratio 3.5 RATIO   VLDL 44 (H) 0 - 40 mg/dL   LDL Cholesterol 87 0 - 99 mg/dL    Comment:         Total Cholesterol/HDL:CHD Risk Coronary Heart Disease Risk Table                     Men   Women  1/2 Average Risk   3.4   3.3  Average Risk       5.0   4.4  2 X Average Risk   9.6   7.1  3 X Average Risk  23.4   11.0        Use the calculated Patient Ratio above and the CHD Risk Table to determine the patient's CHD Risk.        ATP III CLASSIFICATION (LDL):  <100     mg/dL   Optimal  409-811  mg/dL   Near or Above                    Optimal  130-159  mg/dL   Borderline  914-782  mg/dL   High  >956     mg/dL   Very High Performed at [facility], [address] [place], [place] [address]   Hemoglobin A1c     Status: Abnormal   Collection Time: 12/27/23 12:26 AM  Result Value Ref Range   Hgb A1c MFr Bld 10.9 (H) 4.8 - 5.6 %    Comment: (NOTE) Pre diabetes:          5.7%-6.4%  Diabetes:              >6.4%  Glycemic control for   <7.0% adults with diabetes    Mean Plasma Glucose 266.13 mg/dL    Comment: Performed at [facility], [address], [place], [place] [address]    CT HEAD POST STROKE FOLLOWUP/TIMED/STAT READ Result Date: 12/27/2023 CLINICAL DATA:  Code stroke.  Neuro deficit, acute, stroke suspected EXAM: CT HEAD WITHOUT CONTRAST TECHNIQUE: Contiguous axial images were obtained from the base of the skull through the vertex without intravenous contrast. RADIATION DOSE REDUCTION: This exam was performed according to the departmental dose-optimization program which includes automated exposure control, adjustment of the mA and/or kV according to patient size and/or use of iterative reconstruction technique. COMPARISON:  CT head April 21, 25. FINDINGS: Brain: Stable intraparenchymal hemorrhage in the pons with intraventricular extension. Interval development of mild hydrocephalus. 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 No midline shift. Similar effacement of the basal cisterns. Vascular: Calcific atherosclerosis. Skull: No acute  fracture. Sinuses/Orbits: Paranasal sinus mucosal thickening IMPRESSION: 1. Stable intraparenchymal hemorrhage in the pons with intraventricular extension. 2. Interval development of mild hydrocephalus. 3. 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 MRI could further characterize if clinically warranted. These results will be called to the ordering clinician or representative by the Radiologist Assistant, and communication documented in the PACS or [employer]. Electronically Signed   By: Stevenson Elbe M.D.   On: 12/27/2023 02:22   DG Chest Portable 1 View Result Date: 12/26/2023 CLINICAL DATA:  Endotracheal tube placement. EXAM: PORTABLE CHEST 1 VIEW COMPARISON:  08/28/2016. FINDINGS: The heart size and mediastinal contours are within normal limits. No consolidation, effusion, or pneumothorax is seen. No acute osseous abnormality. Endotracheal tube terminates 4.2 cm above the carina. An enteric tube courses over the stomach and out of the field of view. IMPRESSION: 1. No  active disease. 2. Support apparatus as described above. Electronically Signed   By: Wyvonnia Heimlich M.D.   On: 12/26/2023 20:28   CT HEAD CODE STROKE WO CONTRAST` Result Date: 12/26/2023 CLINICAL DATA:  Code stroke.  Mental status change, unknown cause EXAM: CT HEAD WITHOUT CONTRAST TECHNIQUE: Contiguous axial images were obtained from the base of the skull through the vertex without intravenous contrast. RADIATION DOSE REDUCTION: This exam was performed according to the departmental dose-optimization program which includes automated exposure control, adjustment of the mA and/or kV according to patient size and/or use of iterative reconstruction technique. COMPARISON:  None Available. FINDINGS: Brain: Acute intraparenchymal hemorrhage in the pons which measures approximately 3.0 x 2.3 x 3.5 cm (estimated volume of 12 mL) there is intraventricular extension into the fourth ventricle. There is also mass effect with  effacement the basal cisterns. No hydrocephalus at this time. 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 Vascular: See forthcoming CTA. Skull: No acute fracture. Sinuses/Orbits: Paranasal sinus mucosal thickening. ASPECTS [facility] Stroke Program Early CT Score) Total score (0-10 with 10 being normal): 10. IMPRESSION: 1. Acute 3.5 cm intraparenchymal hemorrhage in the pons with intraventricular extension into the fourth ventricle and effacement of the basal cisterns. No hydrocephalus at this time. 2. 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 An MRI could provide further evaluation clinically warranted. Findings discussed with Dr. Annabell Key via telephone at 8:10 PM. Electronically Signed   By: Stevenson Elbe M.D.   On: 12/26/2023 20:10    Review of Systems  Unable to perform ROS: Acuity of condition   Blood pressure (!) 134/110, pulse (!) 110, temperature 99.1 F (37.3 C), resp. rate (!) 24, height 5' 7.99" (1.727 m), weight 70 kg, SpO2 97%. Physical Exam Neurological:     Comments: The patient has not had sedation.  He had been noted to be breathing above the vent but currently only is not demonstrating any respiratory effort on his own.  He has a weak cough to deep suctioning.  His pupils are pinpoint.  Corneals are positive.  No doll's maneuver is noted.  There is no movement to deep central pain.  He does have a triple flexor response in the lower extremities.     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 I do not believe any surgical procedure is warranted or will change this patients outcome. Ventriculostomy not indicated or recommended.   Dylan Roberts 12/27/2023, 3:18 AM

## 2023-12-27 NOTE — Consult Note (Addendum)
 NAME:  Dylan Roberts, MRN:  [id number], DOB:  June 24, 1966, LOS: 1 ADMISSION DATE:  12/26/2023, CONSULTATION DATE:  12/27/23 REFERRING MD:  Renaee Caro, CHIEF COMPLAINT:  ICH   History of Present Illness:  Dylan Roberts is a 58 y.o. M with PMH significant for DM, HTN, HL, recent cataract surgery who presented to [facility] with vision loss and L arm weakness.  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  Labs were significant for Na 128, lactic 3.3,  WBC 11, Trop 105, ethanol 16, UDS negative.   Neurosurgery was consulted and did not recommend any emergent intervention.  3% hypertonic was initiated and PCCM consulted for vent management. ABG  7.317/33.9/82/19  Pertinent  Medical History   has a past medical history of Cataract, Diabetes mellitus without complication (HCC), High cholesterol, Hypertension, and Neuropathy.   Significant Hospital Events: Including procedures, antibiotic start and stop dates in addition to other pertinent events   4/21 admit with IPH, intubated on Cleviprex   Interim History / Subjective:  As above   Objective   Blood pressure (!) 144/84, pulse (!) 109, temperature 97.9 F (36.6 C), temperature source Bladder, resp. rate 19, height 5' 7.99" (1.727 m), weight 70 kg, SpO2 99%.    Vent Mode: PRVC FiO2 (%):  [40 %-100 %] 40 % Set Rate:  [22 bmp-26 bmp] 22 bmp Vt Set:  [550 mL] 550 mL PEEP:  [5 cmH20] 5 cmH20 Plateau Pressure:  [20 cmH20] 20 cmH20   Intake/Output Summary (Last 24 hours) at 12/27/2023 0021 Last data filed at 12/26/2023 2051 Gross per 24 hour  Intake 14.6 ml  Output --  Net 14.6 ml   Filed Weights   12/26/23 1929 12/26/23 2216  Weight: 70 kg 70 kg    General: critically ill-appearing M, well nourished, intubated and  sedated HEENT: MM pink/moist, pupils pinpoint, ETT in place Neuro: propofol  paused, over-breathing vent, no gag or corneals, does not withdraw to pain CV: s1s2 rrr, no m/r/g PULM:  clear bilaterally on full vent support, equal chest rise  GI: soft, bsx4 active  Extremities: warm/dry, no edema  Skin: no rashes or lesions   Resolved Hospital Problem list     Assessment & Plan:    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 -HOB elevated 30 degrees. -Plateau pressures less than 30 cm H20.  -Follow chest x-ray, ABG prn.   -Bronchial hygiene and RT/bronchodilator protocol. -would likely benefit from palliative consult    ETOH use No noted hx of abuse, level 16 -monitor for signs of withdrawal  Best Practice (right click and "Reselect all SmartList Selections" daily)   Diet/type: NPO DVT prophylaxis SCD Pressure ulcer(s): N/A GI prophylaxis: H2B Lines: Central line Foley:  Yes, and it is still needed Code Status:  full code Last date of multidisciplinary goals of care  discussion [per primary]  Labs   CBC: Recent Labs  Lab 12/26/23 1927 12/26/23 2252  WBC 11.1*  --   NEUTROABS 5.8  --   HGB 13.1 13.3  HCT 39.2 39.0  MCV 91.0  --   PLT 295  --     Basic Metabolic Panel: Recent Labs  Lab 12/26/23 1927 12/26/23 2252  NA 128* 132*  K 3.6 4.4  CL 96*  --   CO2 26  --   GLUCOSE 129*  --   BUN 17   --   CREATININE 0.54*  --   CALCIUM 8.4*  --    GFR: Estimated Creatinine Clearance: 97.4 mL/min (A) (by C-G formula based on SCr of 0.54 mg/dL (L)). Recent Labs  Lab 12/26/23 1927  WBC 11.1*  LATICACIDVEN 3.3*    Liver Function Tests: Recent Labs  Lab 12/26/23 1927  AST 37  ALT 42  ALKPHOS 171*  BILITOT 0.8  PROT 6.0*  ALBUMIN 2.1*   No results for input(s): "LIPASE", "AMYLASE" in the last 168 hours. No results for input(s): "AMMONIA" in the last 168 hours.  ABG    Component Value Date/Time   PHART 7.317 (L) 12/26/2023 2252   PCO2ART 33.9 12/26/2023 2252   PO2ART 82 (L) 12/26/2023 2252   HCO3 17.5 (L) 12/26/2023 2252   TCO2 19 (L) 12/26/2023 2252   ACIDBASEDEF 8.0 (H) 12/26/2023 2252   O2SAT 96 12/26/2023 2252     Coagulation Profile: Recent Labs  Lab 12/26/23 1927  INR 0.9    Cardiac Enzymes: No results for input(s): "CKTOTAL", "CKMB", "CKMBINDEX", "TROPONINI" in the last 168 hours.  HbA1C: Hgb A1c MFr Bld  Date/Time Value Ref Range Status  11/28/2013 05:46 AM 10.1 (H) <5.7 % Final    Comment:    (NOTE)                                                                       According to the ADA Clinical Practice Recommendations for 2011, when HbA1c is used as a screening test:  >=6.5%   Diagnostic of Diabetes Mellitus           (if abnormal result is confirmed) 5.7-6.4%   Increased risk of developing Diabetes Mellitus References:Diagnosis and Classification of Diabetes Mellitus,Diabetes Care,2011,34(Suppl 1):S62-S69 and Standards of Medical Care in         Diabetes - 2011,Diabetes Care,2011,34 (Suppl 1):S11-S61.    CBG: Recent Labs  Lab 12/26/23 1927  GLUCAP 176*    Review of Systems:   Unable to obtain   Past Medical History:  He,  has a past medical history of Cataract, Diabetes mellitus without complication (HCC), High cholesterol, Hypertension, and Neuropathy.   Surgical History:   Past Surgical History:  Procedure Laterality Date    CATARACT EXTRACTION W/PHACO Right 11/15/2023   Procedure: PHACOEMULSIFICATION, CATARACT, WITH IOL INSERTION;  Surgeon: Tarri Farm, MD;  Location: [facility];  Service: Ophthalmology;  Laterality: Right;  CDE: 37.81   CATARACT EXTRACTION W/PHACO Left 12/10/2023   Procedure: PHACOEMULSIFICATION, CATARACT, WITH IOL INSERTION;  Surgeon: Tarri Farm, MD;  Location: [facility];  Service: Ophthalmology;  Laterality: Left;  CDE: 35.90   EAR CYST EXCISION Left    Dr. Tina Fordyce   INGUINAL HERNIA REPAIR  Right 12/09/2012   Procedure: HERNIA REPAIR INGUINAL ADULT;  Surgeon: Beau Bound, MD;  Location: [facility];  Service: General;  Laterality: Right;  Right Inguinal Herniorraphy   INSERTION OF MESH Right 12/09/2012   Procedure: INSERTION OF MESH;  Surgeon: Beau Bound, MD;  Location: [facility];  Service: General;  Laterality: Right;  Insertion of Mesh Plug Right Inguinal     Social History:   reports that he has been smoking cigarettes. He has a 7.5 pack-year smoking history. He has never used smokeless tobacco. He reports current alcohol  use of about 42.0 standard drinks of alcohol  per week. He reports that he does not use drugs.   Family History:  His family history includes Diabetes in his mother.   Allergies No Known Allergies   Home Medications  Prior to Admission medications   Medication Sig Start Date End Date Taking? Authorizing Provider  gabapentin  (NEURONTIN ) 300 MG capsule Take 1 capsule (300 mg total) by mouth 3 (three) times daily. 1 pill day 1, 2 pills day 2 then three times a day. Patient not taking: Reported on 08/28/2016 06/11/16   Mozell Arias, MD  glipiZIDE (GLUCOTROL) 10 MG tablet Take 10 mg by mouth 2 (two) times daily before a meal.  08/03/16   [provider]  HYDROcodone -acetaminophen  (NORCO) 5-325 MG tablet Take 1 tablet by mouth every 4 (four) hours as needed. 08/28/16   Carlton Chick, MD  ibuprofen (ADVIL,MOTRIN) 200 MG tablet Take 800 mg by mouth daily as needed for  mild pain.    [provider]  lisinopril (PRINIVIL,ZESTRIL) 5 MG tablet Take 5 mg by mouth daily.  08/03/16   [provider]  metFORMIN  (GLUCOPHAGE ) 500 MG tablet Take 1 tablet (500 mg total) by mouth 2 (two) times daily with a meal. 06/11/16   Mozell Arias, MD  pregabalin (LYRICA) 75 MG capsule Take 75 mg by mouth 2 (two) times daily.    [provider]  SITagliptin Phosphate (JANUVIA PO) Take 1 tablet by mouth daily.    [provider]     Critical care time: 45 minutes     CRITICAL CARE Performed by: Patt Boozer Lylianna Fraiser   Total critical care time: 45 minutes  Critical care time was exclusive of separately billable procedures and treating other patients.  Critical care was necessary to treat or prevent imminent or life-threatening deterioration.  Critical care was time spent personally by me on the following activities: development of treatment plan with patient and/or surrogate as well as nursing, discussions with consultants, evaluation of patient's response to treatment, examination of patient, obtaining history from patient or surrogate, ordering and performing treatments and interventions, ordering and review of laboratory studies, ordering and review of radiographic studies, pulse oximetry and re-evaluation of patient's condition.  Patt Boozer Jackqulyn Mendel, PA-C [facility] See Amion for pager If no response to pager , please call [phone] until 7pm After 7:00 pm call Elink  [phone]

## 2023-12-27 NOTE — Procedures (Signed)
 Arterial Line Insertion Start/End4/21/2025 12:46 AM, 12/27/2023 12:46 AM  Patient location: ICU. Preanesthetic checklist: patient identified, site marked, monitors and equipment checked and timeout performed Right, radial was placed Catheter size: 20 G Hand hygiene performed  and maximum sterile barriers used  Allen's test indicative of satisfactory collateral circulation Attempts: 1 Procedure performed without using ultrasound guided technique. Following insertion, dressing applied and Biopatch. Post procedure assessment: normal  Patient tolerated the procedure well with no immediate complications. Additional procedure comments: Positive Allens Test..

## 2023-12-27 NOTE — Progress Notes (Signed)
 SLP Cancellation Note  Patient Details Name: Dylan Roberts MRN: [id number] DOB: 1966-04-22   Cancelled treatment:       Reason Eval/Treat Not Completed: Patient not medically ready (intubated). SLP will f/u as able.    Beth Brooke., M.A. CCC-SLP Acute Rehabilitation Services Office: [phone]  Secure chat preferred  12/27/2023, 8:14 AM

## 2023-12-27 NOTE — Progress Notes (Addendum)
 STROKE TEAM PROGRESS NOTE    SIGNIFICANT HOSPITAL EVENTS  4/21: admitted early AM with IPH, intubated in ED, Cleviprex  started for BP control  Neurosurgery consulted--no interventions recommended  INTERIM HISTORY/SUBJECTIVE Repeat CT head shows stable large pontine hemorrhage with intraventricular extension and interval development of mild hydrocephalus.  Subacute large right PCA territory infarct also noted. Poor exam this morning with pinpoint pupils, no corneal or cough reflex, weak gag reflex, no response to noxious stimuli. Intermittent bilateral ocular bobbing with coordinating bilateral leg spasms ?  Myoclonus.   Remains on cleviprex  gtt with unstable BP, PRNs added.   Thorough discussion held with wife, sister and other family members at bedside regarding poor exam and grave prognosis. Patient made a DNR code status per family's wishes. Neurology available for further goals of care discussions as needed.    OBJECTIVE  CBC    Component Value Date/Time   WBC 16.5 (H) 12/27/2023 0026   RBC 4.44 12/27/2023 0026   HGB 10.9 (L) 12/27/2023 0737   HCT 32.0 (L) 12/27/2023 0737   PLT 316 12/27/2023 0026   MCV 88.1 12/27/2023 0026   MCH 30.6 12/27/2023 0026   MCHC 34.8 12/27/2023 0026   RDW 12.9 12/27/2023 0026   LYMPHSABS 4.4 (H) 12/26/2023 1927   MONOABS 0.5 12/26/2023 1927   EOSABS 0.2 12/26/2023 1927   BASOSABS 0.1 12/26/2023 1927    BMET    Component Value Date/Time   NA 140 12/27/2023 0737   K 4.0 12/27/2023 0737   CL 96 (L) 12/26/2023 1927   CO2 26 12/26/2023 1927   GLUCOSE 129 (H) 12/26/2023 1927   BUN 17 12/26/2023 1927   CREATININE 0.54 (L) 12/26/2023 1927   CALCIUM 8.4 (L) 12/26/2023 1927   GFRNONAA >60 12/26/2023 1927    IMAGING past 24 hours CT HEAD POST STROKE FOLLOWUP/TIMED/STAT READ Result Date: 12/27/2023 CLINICAL DATA:  Code stroke.  Neuro deficit, acute, stroke suspected EXAM: CT HEAD WITHOUT CONTRAST TECHNIQUE: Contiguous axial images were  obtained from the base of the skull through the vertex without intravenous contrast. RADIATION DOSE REDUCTION: This exam was performed according to the departmental dose-optimization program which includes automated exposure control, adjustment of the mA and/or kV according to patient size and/or use of iterative reconstruction technique. COMPARISON:  CT head April 21, 25. FINDINGS: Brain: Stable intraparenchymal hemorrhage in the pons with intraventricular extension. Interval development of mild hydrocephalus. 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 No midline shift. Similar effacement of the basal cisterns. Vascular: Calcific atherosclerosis. Skull: No acute fracture. Sinuses/Orbits: Paranasal sinus mucosal thickening IMPRESSION: 1. Stable intraparenchymal hemorrhage in the pons with intraventricular extension. 2. Interval development of mild hydrocephalus. 3. 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 MRI could further characterize if clinically warranted. These results will be called to the ordering clinician or representative by the Radiologist Assistant, and communication documented in the PACS or [employer]. Electronically Signed   By: Stevenson Elbe M.D.   On: 12/27/2023 02:22   DG Chest Portable 1 View Result Date: 12/26/2023 CLINICAL DATA:  Endotracheal tube placement. EXAM: PORTABLE CHEST 1 VIEW COMPARISON:  08/28/2016. FINDINGS: The heart size and mediastinal contours are within normal limits. No consolidation, effusion, or pneumothorax is seen. No acute osseous abnormality. Endotracheal tube terminates 4.2 cm above the carina. An enteric tube courses over the stomach and out of the field of view. IMPRESSION: 1. No active disease. 2. Support apparatus as described above. Electronically Signed   By: Dorcus Gamble.D.  On: 12/26/2023 20:28   CT HEAD CODE STROKE WO CONTRAST` Result Date: 12/26/2023 CLINICAL DATA:  Code stroke.  Mental status  change, unknown cause EXAM: CT HEAD WITHOUT CONTRAST TECHNIQUE: Contiguous axial images were obtained from the base of the skull through the vertex without intravenous contrast. RADIATION DOSE REDUCTION: This exam was performed according to the departmental dose-optimization program which includes automated exposure control, adjustment of the mA and/or kV according to patient size and/or use of iterative reconstruction technique. COMPARISON:  None Available. FINDINGS: Brain: Acute intraparenchymal hemorrhage in the pons which measures approximately 3.0 x 2.3 x 3.5 cm (estimated volume of 12 mL) there is intraventricular extension into the fourth ventricle. There is also mass effect with effacement the basal cisterns. No hydrocephalus at this time. 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 Vascular: See forthcoming CTA. Skull: No acute fracture. Sinuses/Orbits: Paranasal sinus mucosal thickening. ASPECTS [facility] Stroke Program Early CT Score) Total score (0-10 with 10 being normal): 10. IMPRESSION: 1. Acute 3.5 cm intraparenchymal hemorrhage in the pons with intraventricular extension into the fourth ventricle and effacement of the basal cisterns. No hydrocephalus at this time. 2. 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 An MRI could provide further evaluation clinically warranted. Findings discussed with Dr. Annabell Key via telephone at 8:10 PM. Electronically Signed   By: Stevenson Elbe M.D.   On: 12/26/2023 20:10    Vitals:   12/27/23 0810 12/27/23 0815 12/27/23 0820 12/27/23 0825  BP: (!) 175/78 (!) 149/66 110/70 118/71  Pulse: (!) 102 98 86 85  Resp: (!) 21 (!) 23 (!) 33 (!) 26  Temp: 99.1 F (37.3 C) 99.1 F (37.3 C) 99.1 F (37.3 C) 99.1 F (37.3 C)  TempSrc:      SpO2: 100% 100% 96% 99%  Weight:      Height:        NEURO:  Patient is intubated, on NO sedation.  Comatose and unresponsive. Cranial Nerves:  II: Pinpoint pupils  III, IV, VI: No corneal  reflex VII: UTA due to ETT IX, X: Weak gag, no cough  Motor/Sensory: No spontaneous movement or response to noxious stimuli     Coordination: UTA Gait- deferred  Most Recent NIH: 37    ASSESSMENT/PLAN  Dylan Roberts is a 58 y.o. male with a PMHx of DM, HTN, hypercholesterolemia, cataract surgery about 1.5 week ago and neuropathy who presented to the [facility] ED via EMS with AMS, vision loss and asymmetric pupils. LKN 18:15. 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 Prognosis is poor with ICH score of 4,  Intracerebral Hemorrhage:  bilateral pons and midbrain and a subacute right PCA infarct Etiology:  likely hypertensive  Code Stroke CT head  Acute 3.5 cm intraparenchymal hemorrhage in the pons with intraventricular extension into the fourth ventricle and effacement of the basal cisterns. No hydrocephalus at this time. Likely subacute or remote right PCA territory infarct CT follow-up Stable intraparenchymal hemorrhage in the pons with intraventricular extension. Interval development of mild hydrocephalus. Re-demonstrated right PCA territory infarct, most likely subacute.  2D Echo: EF 55 to 60%, mild LVH, grade 1 diastolic dysfunction LDL 87 HgbA1c 10.9 VTE prophylaxis - SCDs No antithrombotic prior to admission, now on No antithrombotic  due to ICH.  Therapy recommendations/Disposition:  pending further goals of care discussions with family  Brain compression Mass effect Neurosurgery consulted, no recommended interventions  Continue hypertonic saline, pending next sodium level 3% @ 37ml/hr Na checks Q6H Na: 128->132->135->140  Acute respiratory failure Secondary to ICH with IVE and brain compression CCM management, appreciate assistance VAT bundle Mentation precludes extubation SBT as tolerated  Hypertension Home meds:  none Unstable Continue  Cleviprex  gtt Add Labetalol  PRN Q2H, Hydralazine  in conjunction with gtt Blood Pressure Goal: SBP less than 160   Hyperlipidemia Home meds:  none LDL 87, goal < 70 Consider statin if improved neuro status  Diabetes type II Uncontrolled Home meds:  none HgbA1c 10.9, goal < 7.0 CBGs SSI If improved neuro status, will consult diabetes educator  Tobacco Abuse Patient is a current smoker Cessation education if neuro status improves  Dysphagia Patient has post-stroke dysphagia, SLP consulted    Diet   Diet NPO time specified   Advance diet as tolerated  Other Stroke Risk Factors ETOH use, alcohol  level 16, mentation precludes counseling at this time CIWA protocol as appropriate  DNR status 4/21 AM per family's wishes.  This change was made after thorough discussion at bedside with the patient's wife, sister and multiple other family members with Dr. Arnetta Lank he, RN Jerral Moos and NP Amelie Baize at bedside.  Hospital day # 1   Pt seen by Neuro NP/APP and later by MD. Note/plan to be edited by MD as needed.    Audrene Lease, DNP, AGACNP-BC [facility] Please use AMION for contact information & EPIC for messaging.  I have personally obtained history,examined this patient, reviewed notes, independently viewed imaging studies, participated in medical decision making and plan of care.ROS completed by me personally and pertinent positives fully documented  I have made any additions or clarifications directly to the above note. Agree with note above.  Patient presented with altered mental status with left hemiparesis and vision loss with significantly elevated blood pressure and in the ambulance stopped breathing requiring mechanical ventilation.  Neurological exam is very poor with patient having only ocular bobbing and weak gag.  CT scan shows massive pontine hemorrhage with intraventricular extension and mild hydrocephalus as well as left subacute right PCA infarct.  This hemorrhage  is likely nonsurvivable.  Chances of patient surviving this and making meaningful neurological recovery are quite slim.  Long discussion at the bedside with the patient's wife and sister and multiple family members and answered questions about his poor prognosis.  Continue strict blood pressure control with systolic goal between 130-150 for the first 24 hours and then below 160.  Continue hypertonic saline with serum sodium goal 150-155.  Continue ventilatory support as per critical care team.  Family understands poor prognosis and agreed to DNR.  They will speak to other family members and decision about withdrawal of ventilatory support soon.  Discussed with Dr. Marce Sensing critical care team This patient is critically ill and at significant risk of neurological worsening, death and care requires constant monitoring of vital signs, hemodynamics,respiratory and cardiac monitoring, extensive review of multiple databases, frequent neurological assessment, discussion with family, other specialists and medical decision making of high complexity.I have made any additions or clarifications directly to the above note.This critical care time does not reflect procedure time, or teaching time or supervisory time of PA/NP/Med Resident etc but could involve care discussion time.  I spent 50 minutes of neurocritical care time  in the care of  this patient.      Ardella Beaver, MD Medical Director [facility] St[facility] Pager: [phone] 12/27/2023 2:27 PM  To contact Stroke Continuity provider, please refer  to [email or web address]. After hours, contact General Neurology

## 2023-12-27 NOTE — Progress Notes (Addendum)
 NAME:  Dylan Roberts, MRN:  [id number], DOB:  [birth year], LOS: 1 ADMISSION DATE:  12/26/2023, CONSULTATION DATE:  12/27/23 REFERRING MD:  Renaee Caro, CHIEF COMPLAINT:  ICH   History of Present Illness:  Dylan Roberts is a 58 y.o. M with PMH significant for DM, HTN, HL, recent cataract surgery who presented to [facility] with vision loss and L arm weakness.  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  Labs were significant for Na 128, lactic 3.3,  WBC 11, Trop 105, ethanol 16, UDS negative.   Neurosurgery was consulted and did not recommend any emergent intervention.  3% hypertonic was initiated and PCCM consulted for vent management. ABG  7.317/33.9/82/19  Pertinent  Medical History   has a past medical history of Cataract, Diabetes mellitus without complication (HCC), High cholesterol, Hypertension, and Neuropathy.   Significant Hospital Events: Including procedures, antibiotic start and stop dates in addition to other pertinent events   4/21 admit with IPH, intubated on Cleviprex , full body twitching seen on exam  Interim History / Subjective:  Unresponsive on vent off sedation, pupils pin point  Objective   Blood pressure (!) 149/84, pulse 93, temperature 100 F (37.8 C), resp. rate (!) 22, height 5' 7.99" (1.727 m), weight 70 kg, SpO2 100%.    Vent Mode: PRVC FiO2 (%):  [40 %-100 %] 40 % Set Rate:  [22 bmp-26 bmp] 22 bmp Vt Set:  [550 mL] 550 mL PEEP:  [5 cmH20] 5 cmH20 Plateau Pressure:  [17 cmH20-20 cmH20] 17 cmH20   Intake/Output Summary (Last 24 hours) at 12/27/2023 0756 Last data filed at 12/27/2023 0700 Gross per 24 hour  Intake 713.7 ml  Output 325 ml  Net 388.7 ml   Filed Weights   12/26/23 1929 12/26/23 2216  Weight: 70 kg 70 kg   Physical  exam General: Acute on chronic ill-appearing deconditioned middle-aged male lying in bed on mechanical ventilation in no acute distress HEENT: ETT, MM pink/moist, PERRL,  Neuro: Unresponsive, pupils pinpoint, full body twitching seen on exam CV: s1s2 regular rate and rhythm, no murmur, rubs, or gallops,  PULM: Clear to auscultation bilaterally, no increased work of breathing, no added breath sounds GI: soft, bowel sounds active in all 4 quadrants, non-tender, non-distended Extremities: warm/dry, no edema  Skin: no rashes or lesions  Resolved Hospital Problem list     Assessment & Plan:   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 Head of bed elevated 30 degrees. Plateau pressures less than 30 cm H20.  Follow intermittent chest x-ray and ABG.   SAT/SBT as tolerated, mentation preclude extubation  Ensure adequate pulmonary hygiene  Follow cultures  VAP bundle in place  PAD protocol  ETOH use -No noted hx of abuse, level 16 P: CIWA protocol Monitor for signs of withdrawal  Best Practice (right click and "Reselect all SmartList Selections" daily)   Diet/type: NPO DVT prophylaxis SCD Pressure ulcer(s): N/A GI prophylaxis: H2B Lines: Central line Foley:  Yes, and it is still needed Code Status:  full code Last date of multidisciplinary goals of care discussion [per  primary]  Critical care time: Additional 30 minutes of critical care time    Josselyne Onofrio D. Harris, NP-C [facility] Personal contact information can be found on Amion  If no contact or response made please call [phone] 12/27/2023, 7:59 AM

## 2023-12-27 NOTE — TOC CAGE-AID Note (Signed)
 Transition of Care [facility]) - CAGE-AID Screening   Patient Details  Name: Dylan Roberts MRN: [id number] Date of Birth: September 22, 1965  Transition of Care [facility]) CM/SW Contact:    Jannice Mends, LCSW Phone Number: 12/27/2023, 12:04 PM   Clinical Narrative: Patient unable to participate due to being intubated.    CAGE-AID Screening: Substance Abuse Screening unable to be completed due to: : Patient unable to participate

## 2023-12-27 NOTE — Progress Notes (Signed)
  Echocardiogram 2D Echocardiogram has been performed.  Annis Kinder, RDCS 12/27/2023, 11:35 AM

## 2023-12-28 DIAGNOSIS — J9600 Acute respiratory failure, unspecified whether with hypoxia or hypercapnia: Secondary | ICD-10-CM | POA: Insufficient documentation

## 2023-12-28 DIAGNOSIS — E871 Hypo-osmolality and hyponatremia: Secondary | ICD-10-CM | POA: Insufficient documentation

## 2023-12-28 DIAGNOSIS — G935 Compression of brain: Secondary | ICD-10-CM | POA: Diagnosis not present

## 2023-12-28 DIAGNOSIS — I613 Nontraumatic intracerebral hemorrhage in brain stem: Secondary | ICD-10-CM | POA: Diagnosis not present

## 2023-12-28 DIAGNOSIS — I629 Nontraumatic intracranial hemorrhage, unspecified: Secondary | ICD-10-CM | POA: Diagnosis not present

## 2023-12-28 DIAGNOSIS — J9601 Acute respiratory failure with hypoxia: Secondary | ICD-10-CM | POA: Diagnosis not present

## 2023-12-28 DIAGNOSIS — I161 Hypertensive emergency: Secondary | ICD-10-CM | POA: Insufficient documentation

## 2023-12-28 LAB — SODIUM: Sodium: 142 mmol/L (ref 135–145)

## 2023-12-28 LAB — BASIC METABOLIC PANEL WITH GFR
Anion gap: 6 (ref 5–15)
BUN: 33 mg/dL — ABNORMAL HIGH (ref 6–20)
CO2: 17 mmol/L — ABNORMAL LOW (ref 22–32)
Calcium: 8.1 mg/dL — ABNORMAL LOW (ref 8.9–10.3)
Chloride: 121 mmol/L — ABNORMAL HIGH (ref 98–111)
Creatinine, Ser: 2.74 mg/dL — ABNORMAL HIGH (ref 0.61–1.24)
GFR, Estimated: 26 mL/min — ABNORMAL LOW (ref >60)
Glucose, Bld: 241 mg/dL — ABNORMAL HIGH (ref 70–99)
Potassium: 4.2 mmol/L (ref 3.5–5.1)
Sodium: 144 mmol/L (ref 135–145)

## 2023-12-28 LAB — GLUCOSE, CAPILLARY
Glucose-Capillary: 218 mg/dL — ABNORMAL HIGH (ref 70–99)
Glucose-Capillary: 222 mg/dL — ABNORMAL HIGH (ref 70–99)

## 2023-12-28 MED ORDER — GLYCOPYRROLATE 0.2 MG/ML IJ SOLN
0.2000 mg | INTRAMUSCULAR | Status: DC | PRN
  Administered 2023-12-28: 0.2 mg via INTRAVENOUS
  Filled 2023-12-28: qty 1

## 2023-12-28 MED ORDER — POLYVINYL ALCOHOL 1.4 % OP SOLN: 1.0000 [drp] | Freq: Four times a day (QID) | OPHTHALMIC | Status: DC | PRN

## 2023-12-28 MED ORDER — HYDROMORPHONE HCL-NACL 50-0.9 MG/50ML-% IV SOLN
0.0000 mg/h | INTRAVENOUS | Status: DC
  Administered 2023-12-28: 0.5 mg/h via INTRAVENOUS
  Filled 2023-12-28: qty 50

## 2023-12-28 MED ORDER — SODIUM CHLORIDE 0.9 % IV SOLN: INTRAVENOUS | Status: DC

## 2023-12-28 MED ORDER — GLYCOPYRROLATE 1 MG PO TABS: 1.0000 mg | ORAL_TABLET | ORAL | Status: DC | PRN

## 2023-12-28 MED ORDER — ACETAMINOPHEN 650 MG RE SUPP: 650.0000 mg | Freq: Four times a day (QID) | RECTAL | Status: DC | PRN

## 2023-12-28 MED ORDER — GLYCOPYRROLATE 0.2 MG/ML IJ SOLN: 0.2000 mg | INTRAMUSCULAR | Status: DC | PRN

## 2023-12-28 MED ORDER — HYDROMORPHONE BOLUS VIA INFUSION: 1.0000 mg | INTRAVENOUS | Status: DC | PRN

## 2023-12-28 MED ORDER — MIDAZOLAM HCL 2 MG/2ML IJ SOLN
2.0000 mg | INTRAMUSCULAR | Status: DC | PRN
  Administered 2023-12-28: 4 mg via INTRAVENOUS
  Filled 2023-12-28: qty 4

## 2023-12-28 MED ORDER — ACETAMINOPHEN 325 MG PO TABS: 650.0000 mg | ORAL_TABLET | Freq: Four times a day (QID) | ORAL | Status: DC | PRN

## 2023-12-30 ENCOUNTER — Telehealth: Payer: Self-pay | Admitting: Neurology

## 2023-12-30 NOTE — Telephone Encounter (Signed)
 MD made aware.

## 2023-12-30 NOTE — Telephone Encounter (Signed)
 [facility] @ [facility] [phone] states Dr Janett Medin needs to log into the appropriate database and sign off of the death certificate for pt. This message is being routed to Dr Janett Medin and POD 3

## 2023-12-31 LAB — CULTURE, BLOOD (ROUTINE X 2)
Special Requests: ADEQUATE
Special Requests: ADEQUATE

## 2023-12-31 MED FILL — Fentanyl Citrate Preservative Free (PF) Inj 100 MCG/2ML: INTRAMUSCULAR | Qty: 2 | Status: AC

## 2024-01-06 NOTE — Progress Notes (Signed)
 NAME:  Dylan Roberts, MRN:  [id number], DOB:  01/20/1966, LOS: 2 ADMISSION DATE:  12/26/2023, CONSULTATION DATE:  01-15-24 REFERRING MD:  Renaee Caro, CHIEF COMPLAINT:  ICH   History of Present Illness:  Dylan Roberts is a 58 y.o. M with PMH significant for DM, HTN, HL, recent cataract surgery who presented to [facility] with vision loss and L arm weakness.  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  Labs were significant for Na 128, lactic 3.3,  WBC 11, Trop 105, ethanol 16, UDS negative.   Neurosurgery was consulted and did not recommend any emergent intervention.  3% hypertonic was initiated and PCCM consulted for vent management. ABG  7.317/33.9/82/19  Pertinent  Medical History   has a past medical history of Cataract, Diabetes mellitus without complication (HCC), High cholesterol, Hypertension, and Neuropathy.  Significant Hospital Events: Including procedures, antibiotic start and stop dates in addition to other pertinent events   4/21 admit with IPH, intubated on Cleviprex , full body twitching seen on exam  Interim History / Subjective:  Remains unresponsive. Neurology has been discussing prognosis with the family who has decided to transition to comfort care.   Objective   Blood pressure 117/66, pulse 70, temperature 98.4 F (36.9 C), resp. rate 20, height 5' 7.99" (1.727 m), weight 70 kg, SpO2 100%.    Vent Mode: PRVC FiO2 (%):  [40 %] 40 % Set Rate:  [22 bmp] 22 bmp Vt Set:  [550 mL] 550 mL PEEP:  [5 cmH20] 5 cmH20 Plateau Pressure:  [10 cmH20-17 cmH20] 17 cmH20   Intake/Output Summary (Last 24 hours) at 01-15-24 0930 Last data filed at 01/15/2024 0800 Gross per 24 hour  Intake 2348.03 ml  Output 1375 ml  Net 973.03 ml   Filed Weights   12/26/23 1929  12/26/23 2216  Weight: 70 kg 70 kg   Physical exam General: Acute on chronic ill-appearing deconditioned middle-aged male lying in bed on mechanical ventilation in no acute distress HEENT: ETT, OGT in place,  Neuro: Unresponsive, pupils pinpoint, full body twitching seen on exam - unchanged.  CV: Normal HS  PULM: Clear bilaterally.  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  - Family has opted to transition to comfort care.   Best Practice (right click and "Reselect all SmartList Selections" daily)   Diet/type: NPO DVT prophylaxis SCD Pressure ulcer(s): N/A GI prophylaxis: H2B Lines: Central line Foley:  Yes, and it is still needed Code Status:  DNR - Comfort care  Last date of multidisciplinary goals of care discussion [per primary]  CRITICAL CARE Performed by: Arlina Lair   Total critical care time: 35 minutes  Critical care time was exclusive of separately billable procedures and treating other patients.  Critical care was necessary to treat or prevent imminent or life-threatening deterioration.  Critical care was time spent personally by me on the following  activities: development of treatment plan with patient and/or surrogate as well as nursing, discussions with consultants, evaluation of patient's response to treatment, examination of patient, obtaining history from patient or surrogate, ordering and performing treatments and interventions, ordering and review of laboratory  studies, ordering and review of radiographic studies, pulse oximetry, re-evaluation of patient's condition and participation in multidisciplinary rounds.  Arlina Lair, MD [facility] ICU Physician [facility]  Pager: [phone] Mobile: [phone] After hours: [phone].  2024-01-26, 9:30 AM

## 2024-01-06 NOTE — Procedures (Signed)
 Extubation Procedure Note  Patient Details:   Name: Dylan Roberts DOB: 06/28/1966 MRN: [id number]   Airway Documentation:    Vent end date: 17-Jan-2024 Vent end time: 1013   Evaluation  O2 sats:  terminal extubation  Complications: Complications of terminal extubation Patient terminal extubation tolerate procedure well. Bilateral Breath Sounds: Clear, Diminished   No  Pt was terminally extubated per MD order.   Donnivan Villena Jan 17, 2024, 10:14 AM

## 2024-01-06 NOTE — Death Summary Note (Addendum)
  Patient ID: EDEN RHO MRN: [id number] DOB/AGE: [birth year] 58 y.o.  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 M with PMH significant for DM, HTN, HL, recent cataract surgery who was BIB EMS  to [facility] Dec 29, 2023 with vision loss and L arm weakness.  Patient decompensated quickly requiring bagging and intubation on arrival with asymmetrical pupils, bradycardia, hypertension. GCS: 3. Seen by tele-neurology. 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 ICH score: 4. Labs were significant for Na 128, lactic 3.3,  WBC 11, Trop 105, ethanol 16, UDS negative.  Neurosurgery was consulted and did not recommend any emergent intervention. Repeat CTH showed stable hemorrhage with continued intraventricular extension and interval development of mild hydrocephalus. Patient was on high levels of Cleviprex  gtt for BP control. Poor neurological exam with pinpoint pupil, no corneal or cough reflex, intermittent bilateral ocular bobbing.  Thorough discussions held with wife, sister and other family members at bedside regarding poor exam and grave prognosis. 4/21: Patient made a DNR code status per family's wishes. Transitioned to comfort care 12-31-23 am. Time of extubation: 1013. Time of death: 45.  Signed: Audrene Lease, NP 12-31-2023, 11:15 AM  I have personally obtained history,examined this patient, reviewed notes, independently viewed imaging studies, participated in medical decision making and plan of care.ROS completed  by me personally and pertinent positives fully documented  I have made any additions or clarifications directly to the above note. Agree with note above.  Patient presented unfortunately with a massive brainstem hemorrhage controlled nonsurvivable.  After discussion with patient`s wife and multiple family members they agreed to DNR this morning agreed to withdrawal of ventilatory support comfort care measures only.  Patient was extubated on peacefully shortly thereafter  Ardella Beaver, MD Medical Director [facility] Pager: [phone] 2023-12-31 12:02 PM

## 2024-01-06 NOTE — Progress Notes (Cosign Needed)
 STROKE TEAM PROGRESS NOTE    SIGNIFICANT HOSPITAL EVENTS  4/21: admitted early AM with IPH, intubated in ED, Cleviprex  started for BP control  Neurosurgery consulted--no interventions recommended  DNR status  INTERIM HISTORY/SUBJECTIVE  Na 144 this AM. Cr 2.74 Poor neurological exam remains  Further goals of care discussions were held with family at bedside.  Family has decided to transition to comfort care.  OBJECTIVE  CBC    Component Value Date/Time   WBC 16.5 (H) 12/27/2023 0026   RBC 4.44 12/27/2023 0026   HGB 10.9 (L) 12/27/2023 0737   HCT 32.0 (L) 12/27/2023 0737   PLT 316 12/27/2023 0026   MCV 88.1 12/27/2023 0026   MCH 30.6 12/27/2023 0026   MCHC 34.8 12/27/2023 0026   RDW 12.9 12/27/2023 0026   LYMPHSABS 4.4 (H) 12/26/2023 1927   MONOABS 0.5 12/26/2023 1927   EOSABS 0.2 12/26/2023 1927   BASOSABS 0.1 12/26/2023 1927    BMET    Component Value Date/Time   NA 144 Jan 02, 2024 0549   K 4.2 January 02, 2024 0549   CL 121 (H) 01-02-2024 0549   CO2 17 (L) 01/02/24 0549   GLUCOSE 241 (H) 01/02/2024 0549   BUN 33 (H) 01/02/24 0549   CREATININE 2.74 (H) 02-Jan-2024 0549   CALCIUM 8.1 (L) 2024/01/02 0549   GFRNONAA 26 (L) 01/02/2024 0549    IMAGING past 24 hours ECHOCARDIOGRAM COMPLETE Result Date: 12/27/2023    ECHOCARDIOGRAM REPORT   Patient Name:   THURMOND HILDEBRAN Date of Exam: 12/27/2023 Medical Rec #:  [id number]       Height:       68.0 in Accession #:    [id number]      Weight:       154.3 lb Date of Birth:  [birth year]       BSA:          1.831 m Patient Age:    58 years        BP:           149/84 mmHg Patient Gender: M               HR:           96 bpm. Exam Location:  Inpatient Procedure: 2D Echo, Cardiac Doppler and Color Doppler (Both Spectral and Color            Flow Doppler were utilized during procedure). Indications:    Stroke  History:        Patient has no prior history of Echocardiogram examinations.                 Risk Factors:Hypertension and  Diabetes.  Sonographer:    Andrena Bang Referring Phys: ERIC LINDZEN IMPRESSIONS  1. Left ventricular ejection fraction, by estimation, is 55 to 60%. The left ventricle has normal function. The left ventricle has no regional wall motion abnormalities. There is mild concentric left ventricular hypertrophy. Left ventricular diastolic parameters are consistent with Grade I diastolic dysfunction (impaired relaxation).  2. Right ventricular systolic function is normal. The right ventricular size is normal. Tricuspid regurgitation signal is inadequate for assessing PA pressure.  3. The mitral valve is normal in structure. No evidence of mitral valve regurgitation. No evidence of mitral stenosis.  4. The aortic valve is tricuspid. There is mild calcification of the aortic valve. Aortic valve regurgitation is not visualized. No aortic stenosis is present.  5. The inferior vena cava is dilated in size with >50% respiratory variability, suggesting  right atrial pressure of 8 mmHg. FINDINGS  Left Ventricle: Left ventricular ejection fraction, by estimation, is 55 to 60%. The left ventricle has normal function. The left ventricle has no regional wall motion abnormalities. Definity  contrast agent was given IV to delineate the left ventricular  endocardial borders. The left ventricular internal cavity size was normal in size. There is mild concentric left ventricular hypertrophy. Left ventricular diastolic parameters are consistent with Grade I diastolic dysfunction (impaired relaxation). Right Ventricle: The right ventricular size is normal. No increase in right ventricular wall thickness. Right ventricular systolic function is normal. Tricuspid regurgitation signal is inadequate for assessing PA pressure. Left Atrium: Left atrial size was normal in size. Right Atrium: Right atrial size was normal in size. Pericardium: Trivial pericardial effusion is present. Mitral Valve: The mitral valve is normal in structure. No evidence of  mitral valve regurgitation. No evidence of mitral valve stenosis. Tricuspid Valve: The tricuspid valve is normal in structure. Tricuspid valve regurgitation is not demonstrated. Aortic Valve: The aortic valve is tricuspid. There is mild calcification of the aortic valve. Aortic valve regurgitation is not visualized. No aortic stenosis is present. Aortic valve mean gradient measures 2.0 mmHg. Aortic valve peak gradient measures 3.2 mmHg. Aortic valve area, by VTI measures 2.93 cm. Pulmonic Valve: The pulmonic valve was normal in structure. Pulmonic valve regurgitation is not visualized. Aorta: The aortic root is normal in size and structure. Venous: The inferior vena cava is dilated in size with greater than 50% respiratory variability, suggesting right atrial pressure of 8 mmHg. IAS/Shunts: No atrial level shunt detected by color flow Doppler.  LEFT VENTRICLE PLAX 2D LVIDd:         4.40 cm     Diastology LVIDs:         3.60 cm     LV e' medial:    4.90 cm/s LV PW:         1.10 cm     LV E/e' medial:  11.7 LV IVS:        1.30 cm     LV e' lateral:   5.98 cm/s LVOT diam:     2.00 cm     LV E/e' lateral: 9.5 LV SV:         34 LV SV Index:   19 LVOT Area:     3.14 cm  LV Volumes (MOD) LV vol d, MOD A2C: 69.0 ml LV vol d, MOD A4C: 78.3 ml LV vol s, MOD A2C: 20.4 ml LV vol s, MOD A4C: 20.6 ml LV SV MOD A2C:     48.6 ml LV SV MOD A4C:     78.3 ml LV SV MOD BP:      53.4 ml RIGHT VENTRICLE RV S prime:     10.36 cm/s TAPSE (M-mode): 1.2 cm LEFT ATRIUM             Index LA Vol (A2C):   31.4 ml 17.15 ml/m LA Vol (A4C):   31.0 ml 16.94 ml/m LA Biplane Vol: 32.4 ml 17.70 ml/m  AORTIC VALVE AV Area (Vmax):    2.52 cm AV Area (Vmean):   2.40 cm AV Area (VTI):     2.93 cm AV Vmax:           89.50 cm/s AV Vmean:          56.200 cm/s AV VTI:            0.117 m AV Peak Grad:      3.2 mmHg  AV Mean Grad:      2.0 mmHg LVOT Vmax:         71.90 cm/s LVOT Vmean:        42.900 cm/s LVOT VTI:          0.109 m LVOT/AV VTI ratio: 0.93  MITRAL VALVE MV Area (PHT): 2.74 cm    SHUNTS MV Decel Time: 277 msec    Systemic VTI:  0.11 m MV E velocity: 57.10 cm/s  Systemic Diam: 2.00 cm MV A velocity: 67.10 cm/s MV E/A ratio:  0.85 Dalton McleanMD Electronically signed by Archer Bear Signature Date/Time: 12/27/2023/11:37:05 AM    Final     Vitals:   2024-01-09 0715 01-09-24 0730 2024-01-09 0745 01/09/24 0800  BP: (!) 104/57 107/63 108/61 111/60  Pulse: 71 70 70 70  Resp: 20 (!) 22 (!) 22 (!) 22  Temp: 98.1 F (36.7 C) 98.1 F (36.7 C) 98.2 F (36.8 C) 98.4 F (36.9 C)  TempSrc:      SpO2: 99% 99% 99% 100%  Weight:      Height:        NEURO:  Patient is intubated, on NO sedation.  Comatose and unresponsive. Breathing over vent.   Cranial Nerves:  II: Pinpoint pupils  III, IV, VI: No corneal reflex VII: UTA due to ETT IX, X: Weak gag, weak cough  Motor/Sensory: No spontaneous movement. Reflex withdraw seen in BLE.     Coordination: UTA Gait- deferred  Most Recent NIH: 37    ASSESSMENT/PLAN  AIDRIC Dylan Roberts is a 58 y.o. male with a PMHx of DM, HTN, hypercholesterolemia, cataract surgery about 1.5 week ago and neuropathy who presented to [facility] via EMS with AMS, vision loss and asymmetric pupils. LKN 18:15. 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 Prognosis is poor with ICH score of 4.  Intracerebral Hemorrhage:  bilateral pons and midbrain and a subacute right PCA infarct Etiology:  likely hypertensive  Code Stroke CT head  Acute 3.5 cm intraparenchymal hemorrhage in the pons with intraventricular extension into the fourth ventricle and effacement of the basal cisterns. No hydrocephalus at this time. Likely subacute or remote right PCA territory infarct CT follow-up Stable intraparenchymal hemorrhage in the pons with intraventricular extension. Interval development of mild  hydrocephalus. Re-demonstrated right PCA territory infarct, most likely subacute.  2D Echo: EF 55 to 60%, mild LVH, grade 1 diastolic dysfunction LDL 87 HgbA1c 10.9 VTE prophylaxis - SCDs No antithrombotic prior to admission, continue No antithrombotic  due to ICH.  Therapy recommendations/Disposition: comfort care  Brain compression Mass effect Neurosurgery consulted, no recommended interventions Continue hypertonic saline, pending next sodium level 3% @ 65ml/hr--> will discontinue due to transition to comfort care Na: 128->132->135->140->144  Acute respiratory failure Secondary to ICH with IVE and brain compression Pending terminal extubation per transition to comfort care CCM management, appreciate assistance VAT bundle Mentation precludes extubation SBT as tolerated  Hypertension Home meds:  none Unstable Will discontinue Cleviprex  per transition to comfort care  Hyperlipidemia Home meds:  none LDL 87, goal < 70  Diabetes type II Uncontrolled Home meds:  none HgbA1c 10.9, goal < 7.0 CBGs discontinued per transition to comfort care  Tobacco Abuse Patient is a current smoker  Dysphagia Patient has post-stroke dysphagia, SLP consulted    Diet   Diet NPO time specified    Other Stroke Risk Factors  ETOH use, alcohol  level 16, mentation precludes counseling at this time CIWA protocol as appropriate  Other Active Problems AKI Cr 2.74  DNR status 4/21 AM per family's wishes.  Transition to comfort care Dec 29, 2023 AM.  Hospital day # 2   Pt seen by Neuro NP/APP and later by MD. Note/plan to be edited by MD as needed.    Audrene Lease, DNP, AGACNP-BC [facility] Please use AMION for contact information & EPIC for messaging.  I have personally obtained history,examined this patient, reviewed notes, independently viewed imaging studies, participated in medical decision making and plan of care.ROS completed by me personally and pertinent positives fully  documented  I have made any additions or clarifications directly to the above note. Agree with note above.  Patient's neurological exam remains quite poor with small unreactive pupils and absent corneal reflex and only weak gag.  Continues to have ocular bobbing but not as prominent as yesterday.  Long discussion with patient wife and multiple family members at the bedside.  Family agrees with DNR and comfort care measures and patient will be terminally extubated shortly.  Discussed with Dr. Marce Sensing critical care medicine who agrees with the plan. This patient is critically ill and at significant risk of neurological worsening, death and care requires constant monitoring of vital signs, hemodynamics,respiratory and cardiac monitoring, extensive review of multiple databases, frequent neurological assessment, discussion with family, other specialists and medical decision making of high complexity.I have made any additions or clarifications directly to the above note.This critical care time does not reflect procedure time, or teaching time or supervisory time of PA/NP/Med Resident etc but could involve care discussion time.  I spent 30 minutes of neurocritical care time  in the care of  this patient.      Ardella Beaver, MD Medical Director [facility] Stroke Center Pager: [phone] 12/29/2023 3:29 PM   To contact Stroke Continuity provider, please refer to [email or web address]. After hours, contact General Neurology

## 2024-01-06 NOTE — Progress Notes (Signed)
 PT Cancellation Note  Patient Details Name: Dylan Roberts MRN: [id number] DOB: 1966-02-11   Cancelled Treatment:    Reason Eval/Treat Not Completed: Patient not medically ready (discussed with RN; will sign off at this time. Please re-consult if needs change).  Verdia Glad, PT, DPT Acute Rehabilitation Services Office [phone]    Claria Crofts Jan 24, 2024, 8:50 AM

## 2024-01-06 NOTE — Progress Notes (Signed)
 Occupational Therapy Discharge Patient Details Name: Dylan Roberts MRN: [id number] DOB: [birth year] Today's Date: January 04, 2024 Time:  -     Patient discharged from OT services secondary to medical decline - will need to re-order OT to resume therapy services.  Please see latest therapy progress note for current level of functioning and progress toward goals.    Progress and discharge plan discussed with patient and/or caregiver: Patient unable to participate in discharge planning and no caregivers available  GO     [name] 2024/01/04, 8:28 AM

## 2024-01-06 DEATH — deceased
